# Patient Record
Sex: Female | Born: 1945 | Race: Black or African American | Hispanic: No | Marital: Married | Smoking: Former smoker
Health system: Southern US, Community
[De-identification: ages and names within clinical notes are randomized; demographics above are authoritative.]

## PROBLEM LIST (undated history)

## (undated) DIAGNOSIS — I10 Essential (primary) hypertension: Secondary | ICD-10-CM

---

## 2004-09-05 ENCOUNTER — Other Ambulatory Visit: Payer: Self-pay

## 2004-09-05 ENCOUNTER — Emergency Department: Payer: Self-pay | Admitting: Emergency Medicine

## 2005-07-09 ENCOUNTER — Ambulatory Visit: Payer: Self-pay | Admitting: Family Medicine

## 2006-07-15 ENCOUNTER — Ambulatory Visit: Payer: Self-pay | Admitting: Family Medicine

## 2008-01-13 ENCOUNTER — Ambulatory Visit: Payer: Self-pay | Admitting: Family Medicine

## 2009-02-12 ENCOUNTER — Ambulatory Visit: Payer: Self-pay | Admitting: Family Medicine

## 2010-02-27 ENCOUNTER — Ambulatory Visit: Payer: Self-pay | Admitting: Family Medicine

## 2013-12-23 ENCOUNTER — Ambulatory Visit: Payer: Self-pay | Admitting: Family Medicine

## 2014-01-11 ENCOUNTER — Ambulatory Visit: Payer: Self-pay | Admitting: Family Medicine

## 2015-01-09 ENCOUNTER — Other Ambulatory Visit: Payer: Self-pay | Admitting: Family Medicine

## 2015-01-09 DIAGNOSIS — Z1231 Encounter for screening mammogram for malignant neoplasm of breast: Secondary | ICD-10-CM

## 2015-01-11 ENCOUNTER — Other Ambulatory Visit: Payer: Self-pay | Admitting: Family Medicine

## 2015-01-11 ENCOUNTER — Ambulatory Visit
Admission: RE | Admit: 2015-01-11 | Discharge: 2015-01-11 | Disposition: A | Discharge status: Home / Self Care | Payer: Medicare Other | Source: Ambulatory Visit | Attending: Family Medicine | Admitting: Family Medicine

## 2015-01-11 DIAGNOSIS — Z1231 Encounter for screening mammogram for malignant neoplasm of breast: Secondary | ICD-10-CM | POA: Insufficient documentation

## 2015-10-20 IMAGING — CR DG CHEST 2V
1 series · 2 of 2 positions shown · non-contrast
Comparison: Chest x-ray of 12/23/2013

CLINICAL DATA: Pneumonia 1 week ago, followup, persistent cough

EXAM:
CHEST  2 VIEW

[Series 1: w chest pa · 0.14mm/px · 2 of 2 slices shown]
[im 1/2]
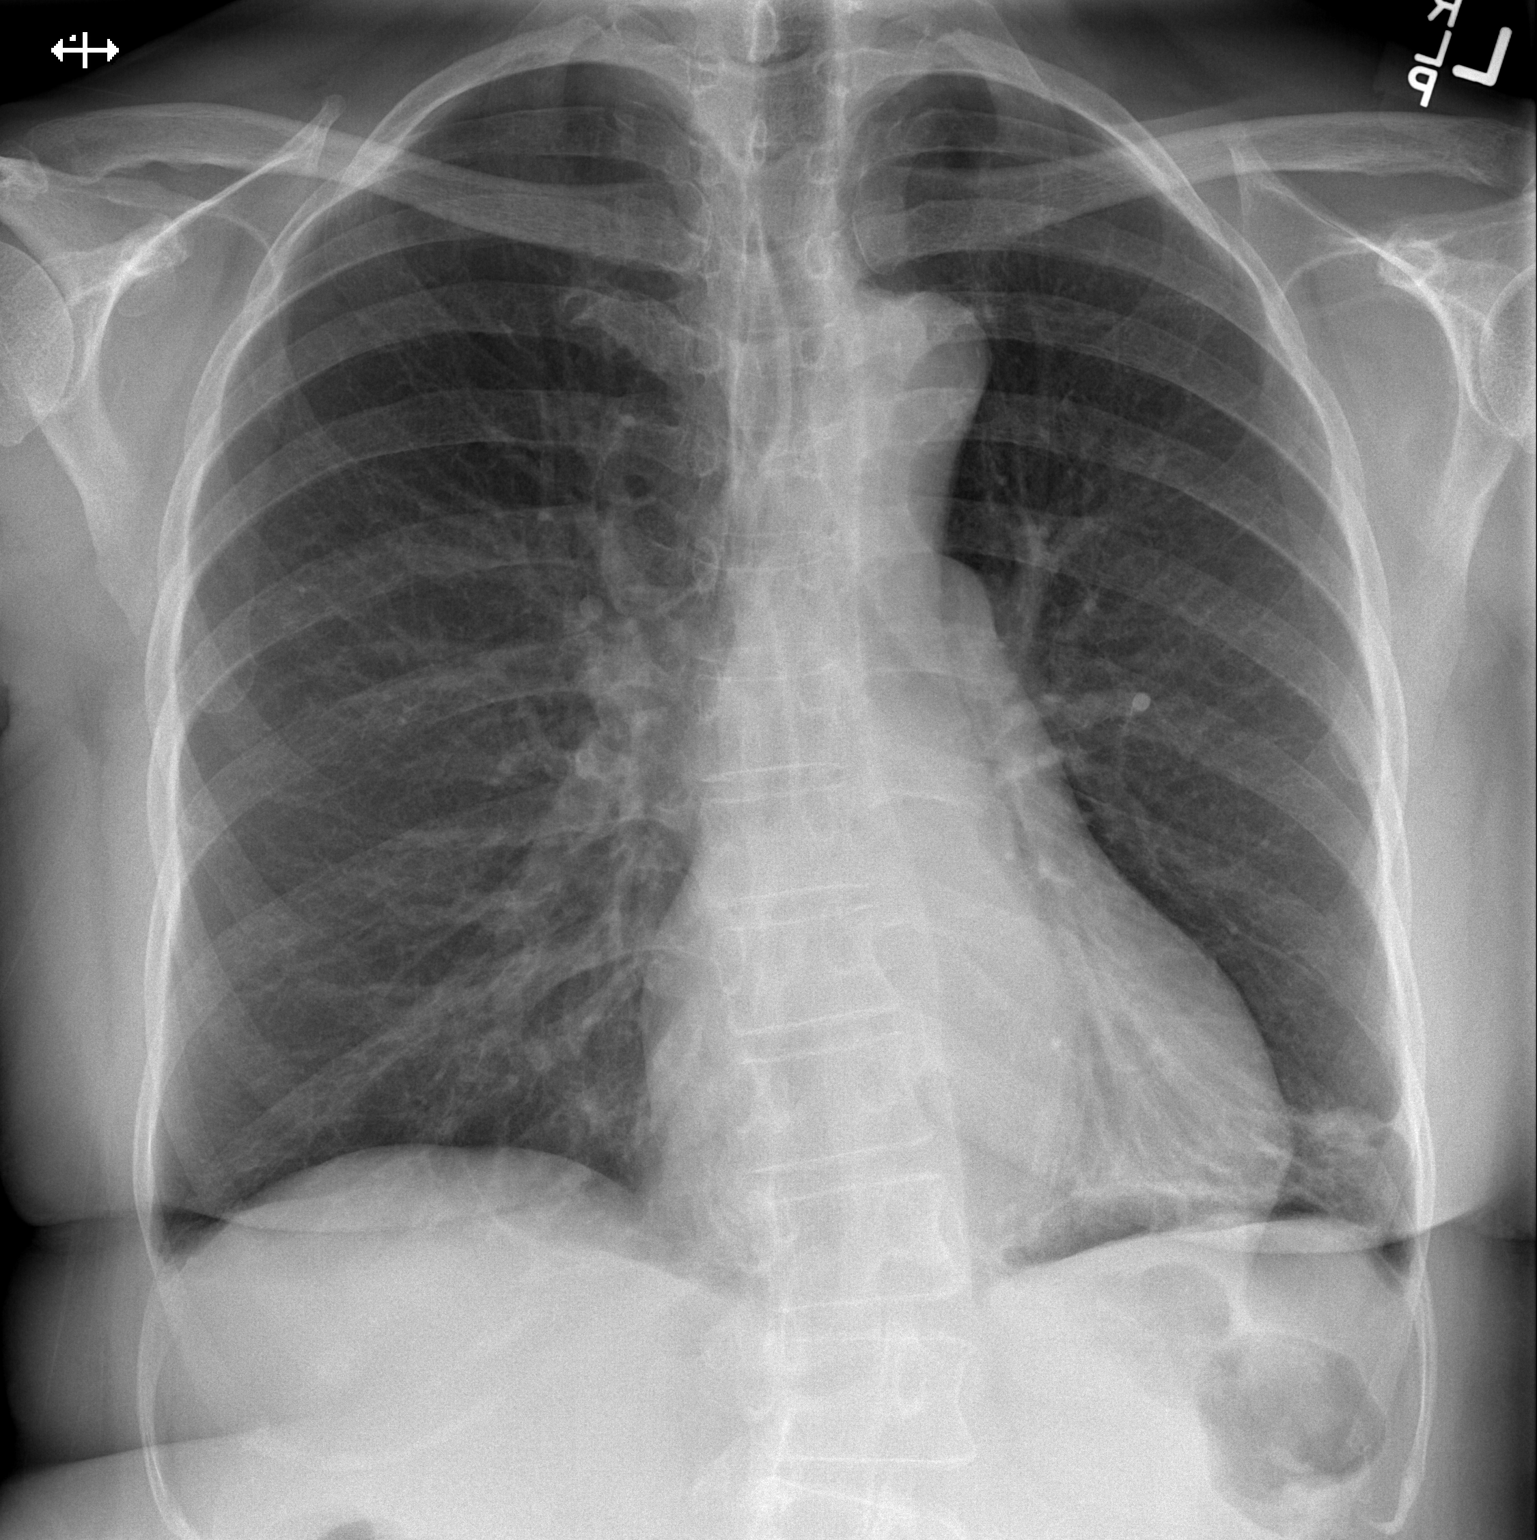
[im 2/2]
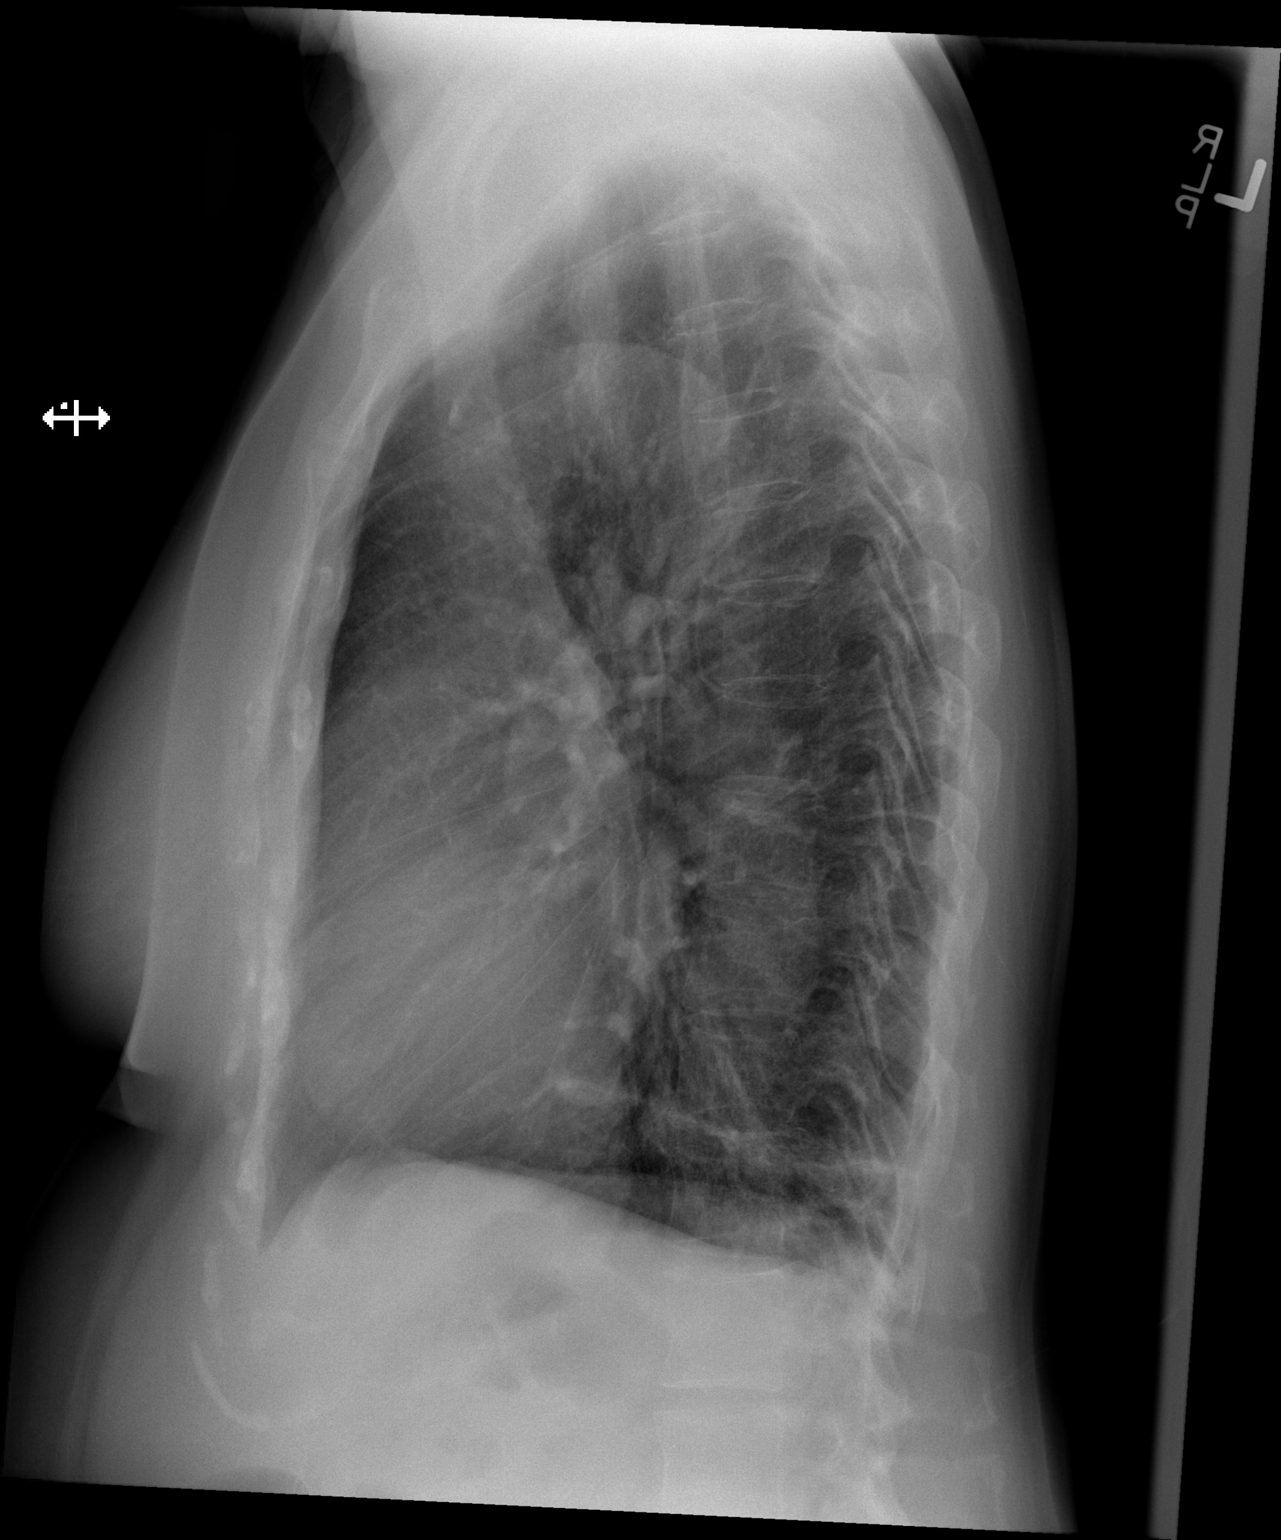

[2 of 2 positions shown; findings below may reference images not displayed]

FINDINGS: Streaky linear opacity remains in the left lower lobe representing
either linear atelectasis or residual pneumonia. Minimal linear
atelectasis or scarring is noted medially at the right lung base as
well. No other infiltrate is seen and no effusion is noted. The
heart is mildly enlarged and stable. No acute bony abnormality is
seen.
IMPRESSION: Linear opacity remains in the left lower lobe representing either
linear atelectasis or residual pneumonia.

## 2016-01-29 ENCOUNTER — Other Ambulatory Visit: Payer: Self-pay | Admitting: Family Medicine

## 2016-01-29 DIAGNOSIS — Z1231 Encounter for screening mammogram for malignant neoplasm of breast: Secondary | ICD-10-CM

## 2016-02-18 ENCOUNTER — Other Ambulatory Visit: Payer: Self-pay | Admitting: Family Medicine

## 2016-02-18 ENCOUNTER — Ambulatory Visit
Admission: RE | Admit: 2016-02-18 | Discharge: 2016-02-18 | Disposition: A | Discharge status: Home / Self Care | Payer: Medicare Other | Source: Ambulatory Visit | Attending: Family Medicine | Admitting: Family Medicine

## 2016-02-18 DIAGNOSIS — Z1231 Encounter for screening mammogram for malignant neoplasm of breast: Secondary | ICD-10-CM | POA: Diagnosis present

## 2016-10-19 IMAGING — MG MM DIGITAL SCREENING BILAT W/ TOMO W/ CAD
9 of 17 series · 9 of 37 positions shown · non-contrast
Comparison: Previous exam(s).

CLINICAL DATA: Screening.

EXAM:
DIGITAL SCREENING BILATERAL MAMMOGRAM WITH 3D TOMO WITH CAD

[R CC]
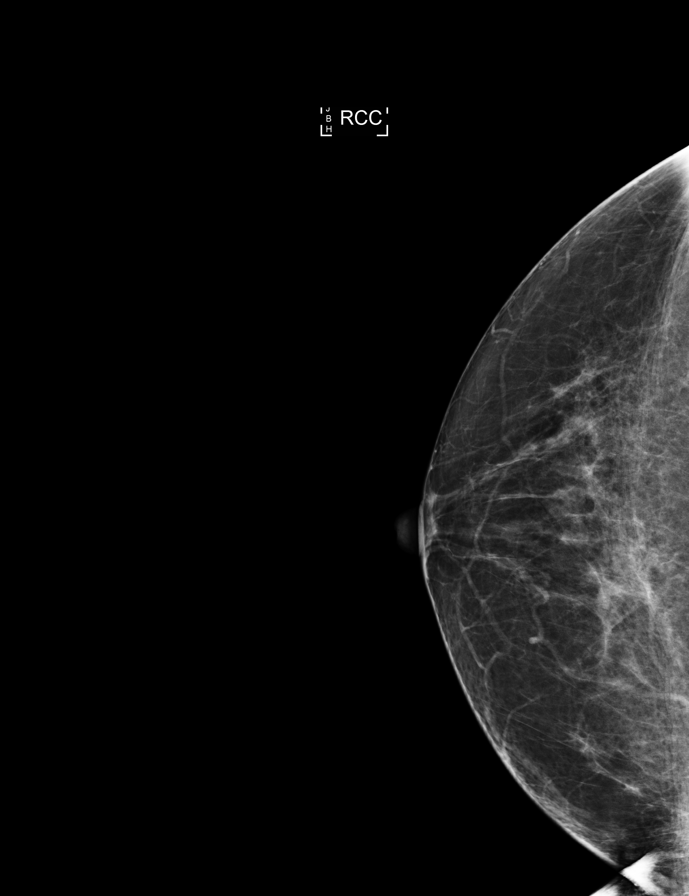

[L MLO (1 of 2)]
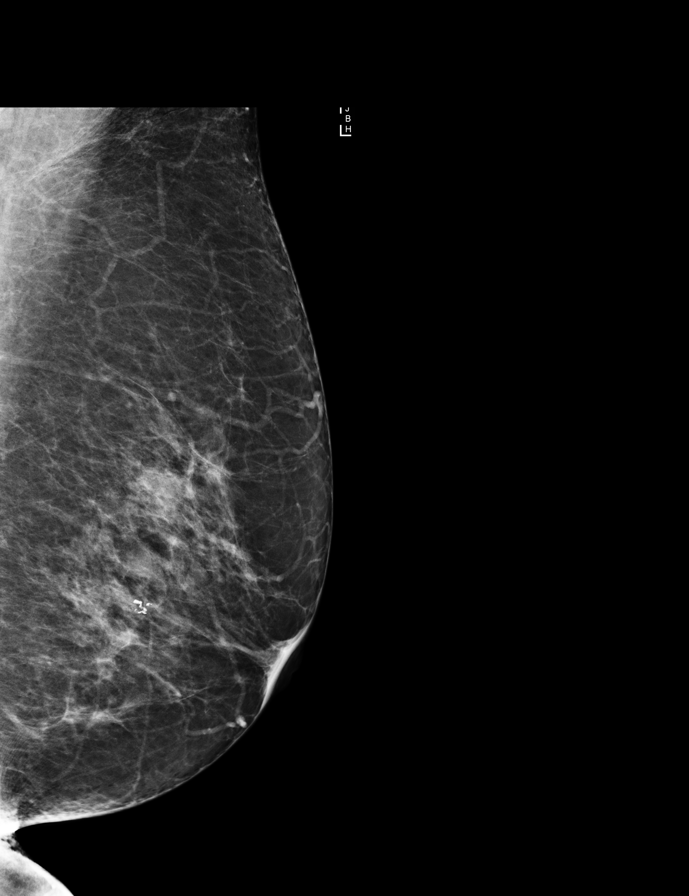

[L CC]
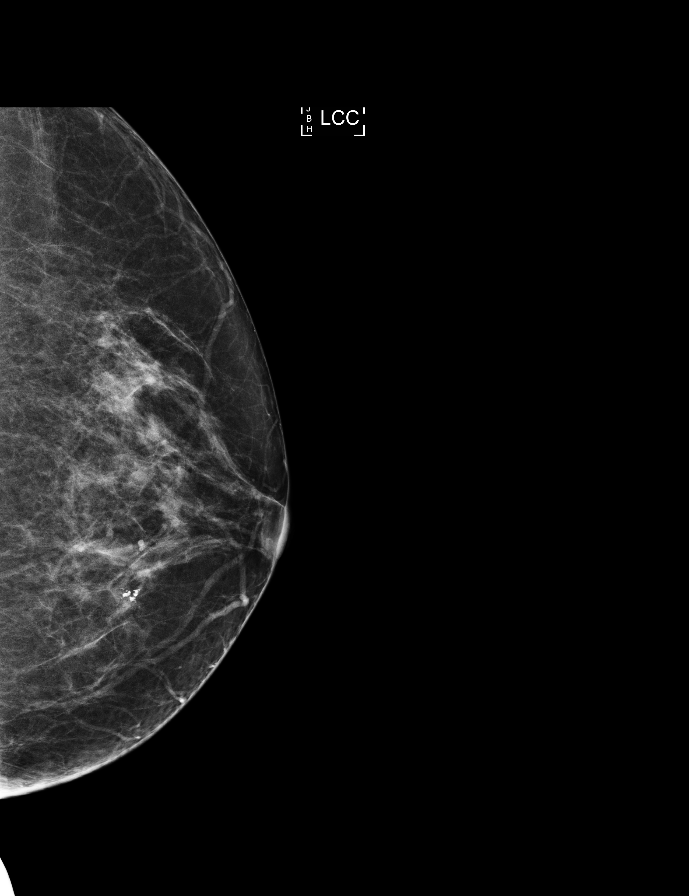

[R CC synth-2D]
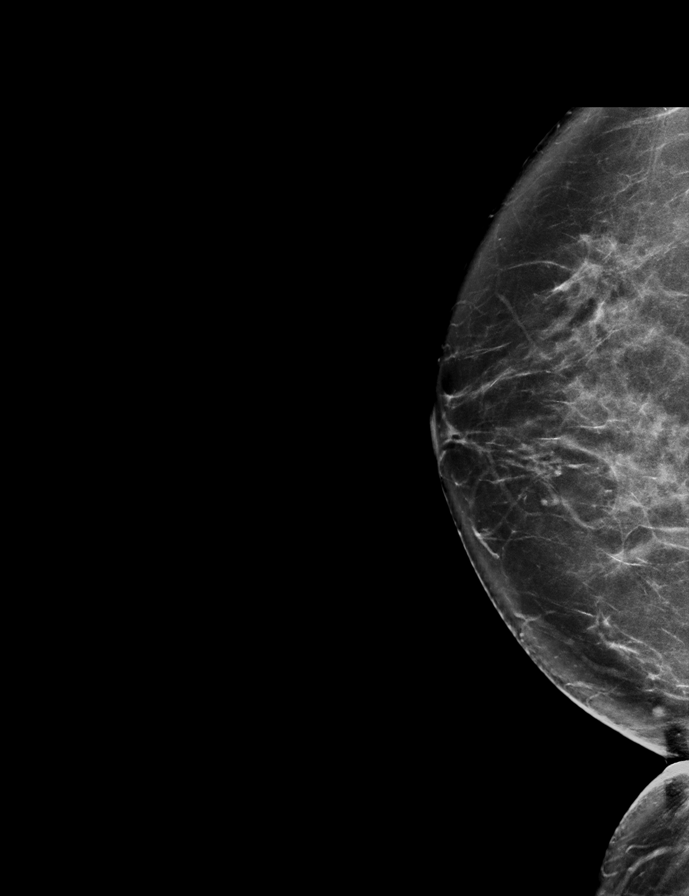

[R MLO synth-2D (1 of 2)]
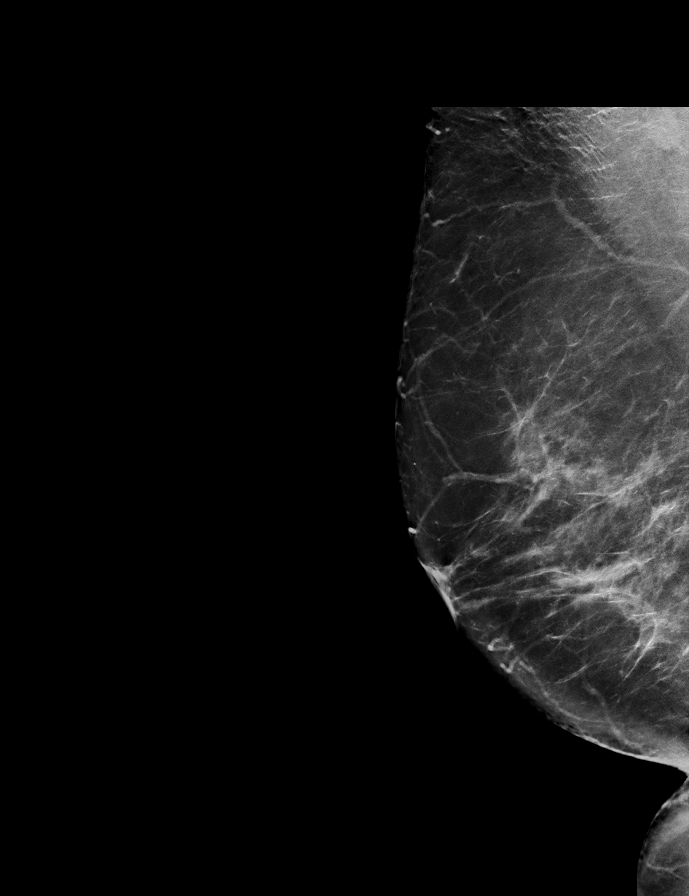

[R MLO (1 of 2)]
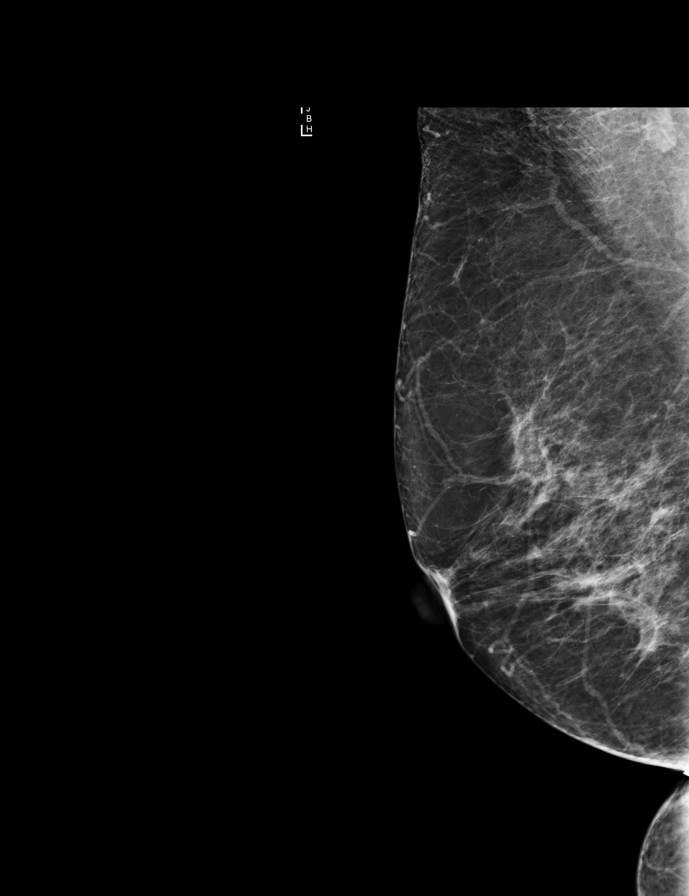

[L MLO (2 of 2)]
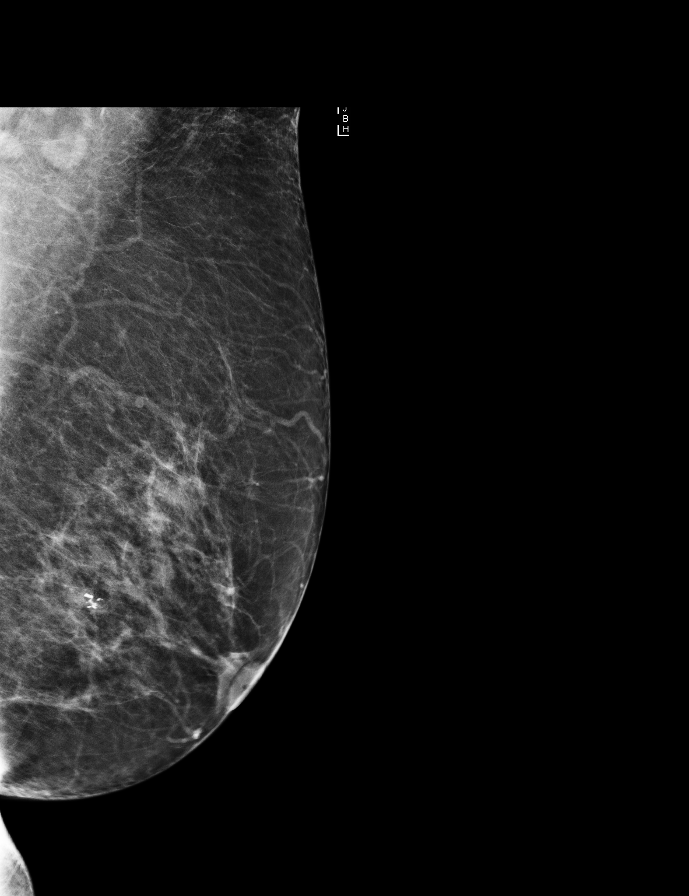

[R MLO (2 of 2)]
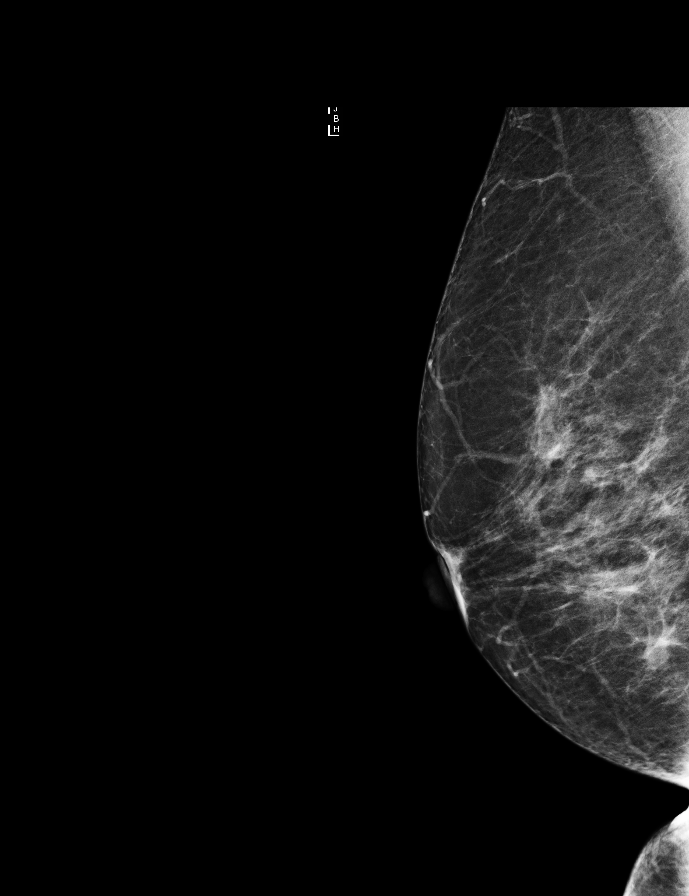

[R MLO synth-2D (2 of 2)]
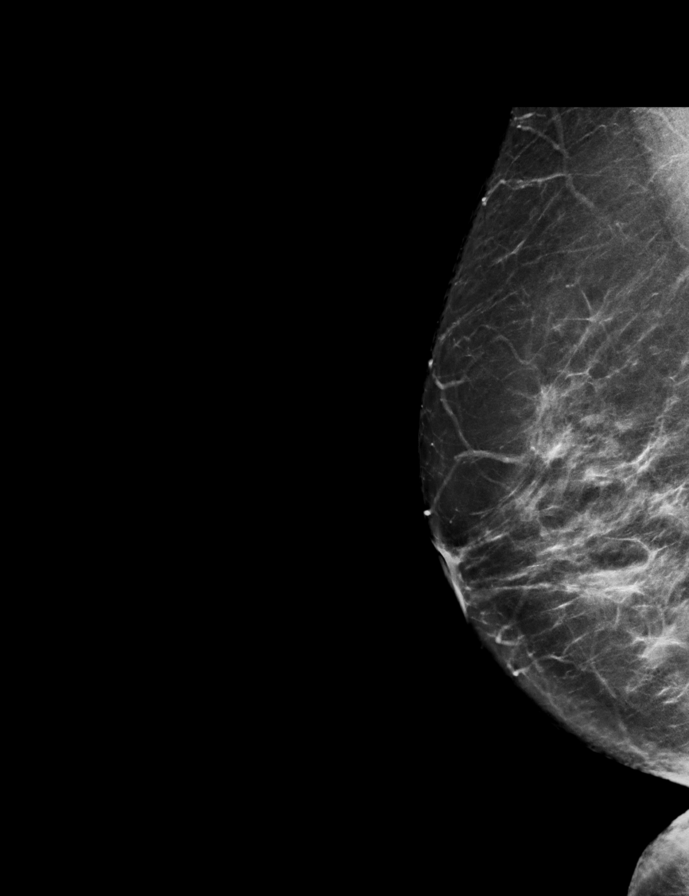

[9 of 37 positions shown; findings below may reference images not displayed]

ACR Breast Density Category c: The breast tissue is heterogeneously
dense, which may obscure small masses.
FINDINGS: There are no findings suspicious for malignancy. Images were
processed with CAD.
IMPRESSION: No mammographic evidence of malignancy. A result letter of this
screening mammogram will be mailed directly to the patient.

RECOMMENDATION:
Screening mammogram in one year. (Code:[id number])

BI-RADS CATEGORY  1: Negative.

## 2017-01-19 ENCOUNTER — Other Ambulatory Visit: Payer: Self-pay | Admitting: Family Medicine

## 2017-01-19 DIAGNOSIS — Z1231 Encounter for screening mammogram for malignant neoplasm of breast: Secondary | ICD-10-CM

## 2017-02-23 ENCOUNTER — Ambulatory Visit
Admission: RE | Admit: 2017-02-23 | Discharge: 2017-02-23 | Disposition: A | Discharge status: Home / Self Care | Payer: Medicare Other | Source: Ambulatory Visit | Attending: Family Medicine | Admitting: Family Medicine

## 2017-02-23 DIAGNOSIS — Z1231 Encounter for screening mammogram for malignant neoplasm of breast: Secondary | ICD-10-CM

## 2017-11-26 IMAGING — MG MM DIGITAL SCREENING BILAT W/ TOMO W/ CAD
9 of 13 series · 9 of 29 positions shown · non-contrast
Comparison: Previous exam(s).

CLINICAL DATA: Screening.

EXAM:
2D DIGITAL SCREENING BILATERAL MAMMOGRAM WITH CAD AND ADJUNCT TOMO

[R MLO (1 of 2)]
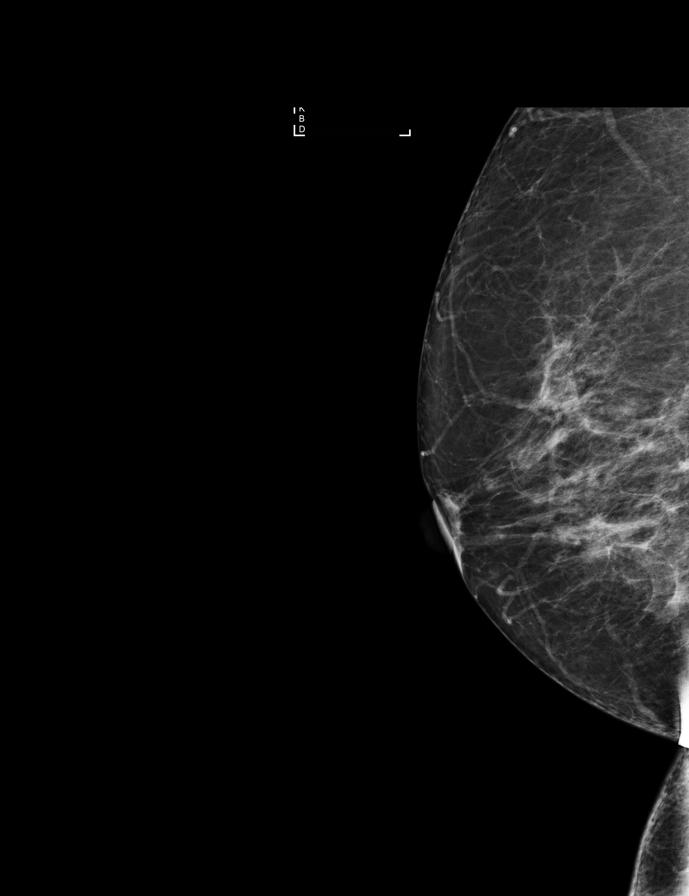

[R MLO (2 of 2)]
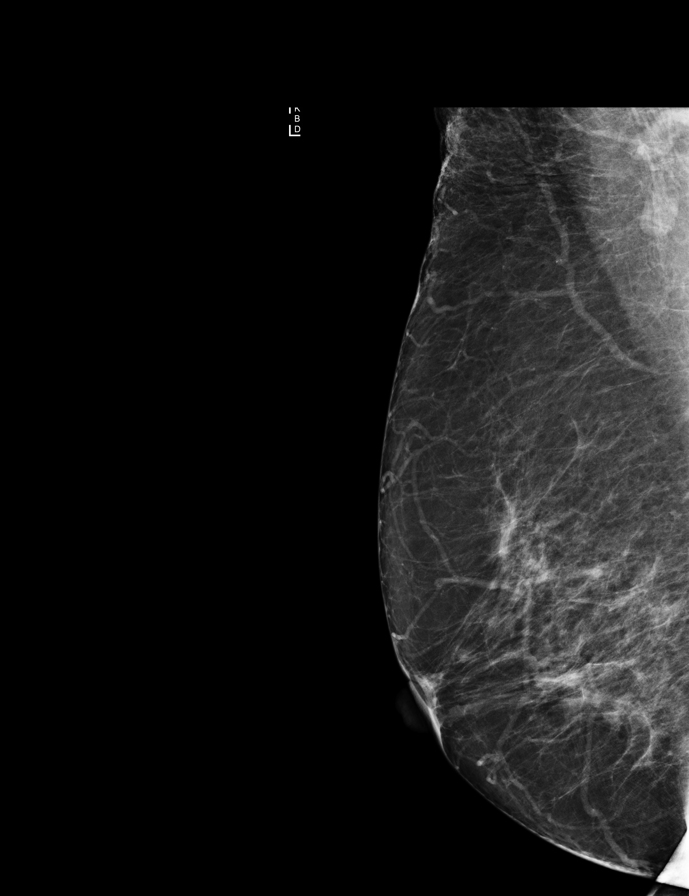

[R CC synth-2D]
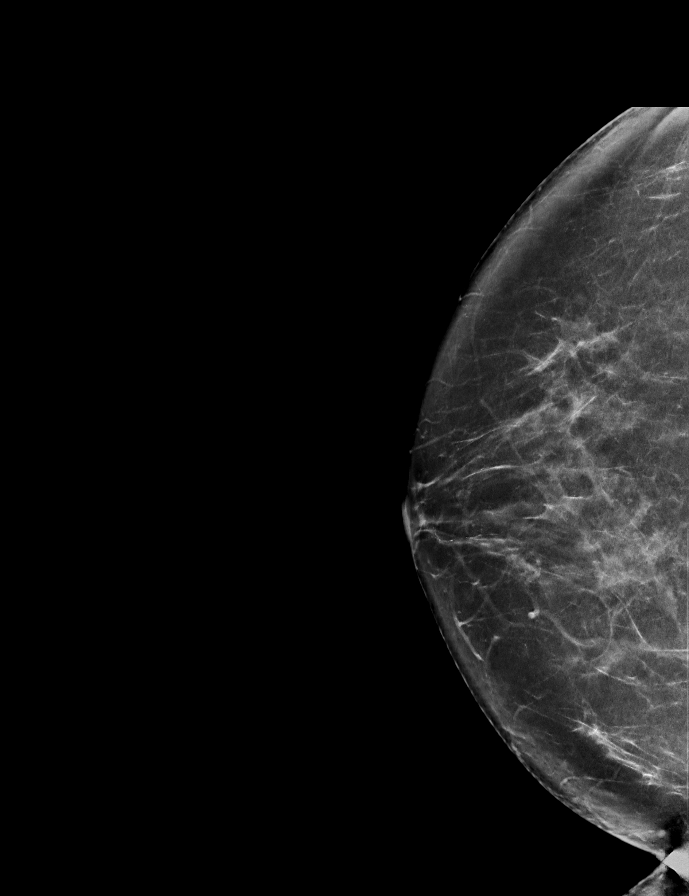

[L CC]
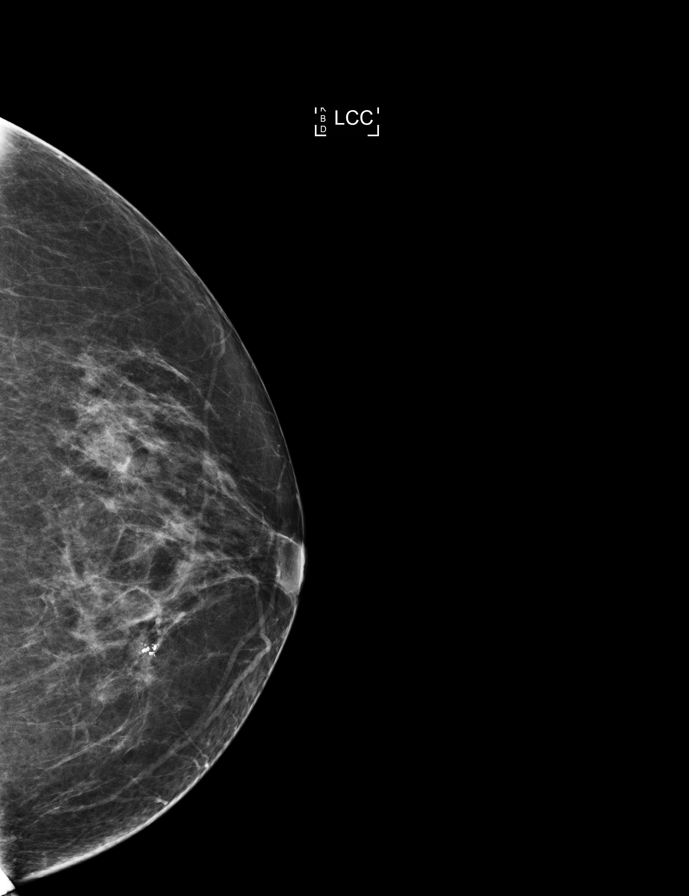

[L MLO synth-2D]
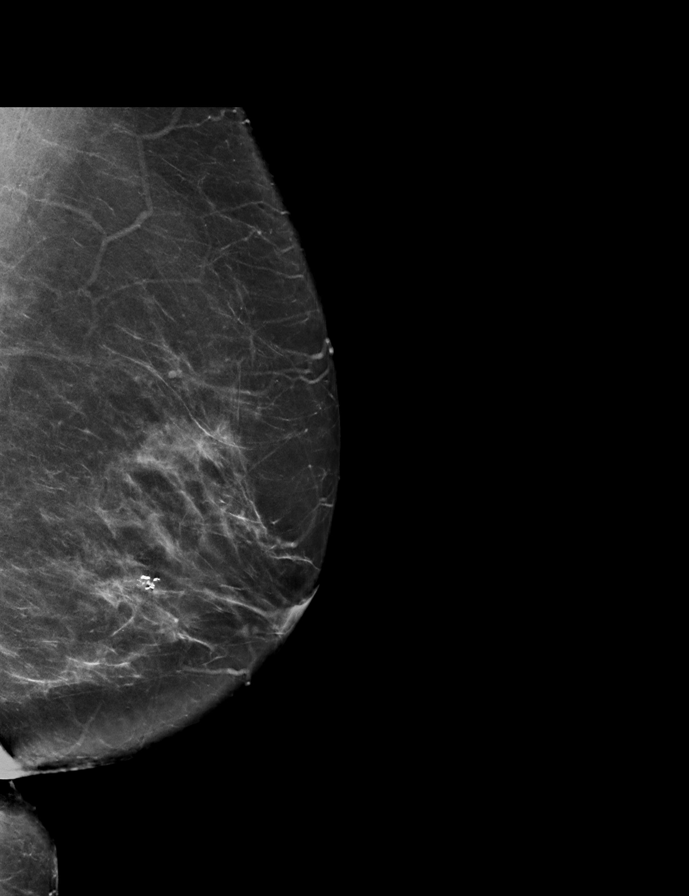

[R CC]
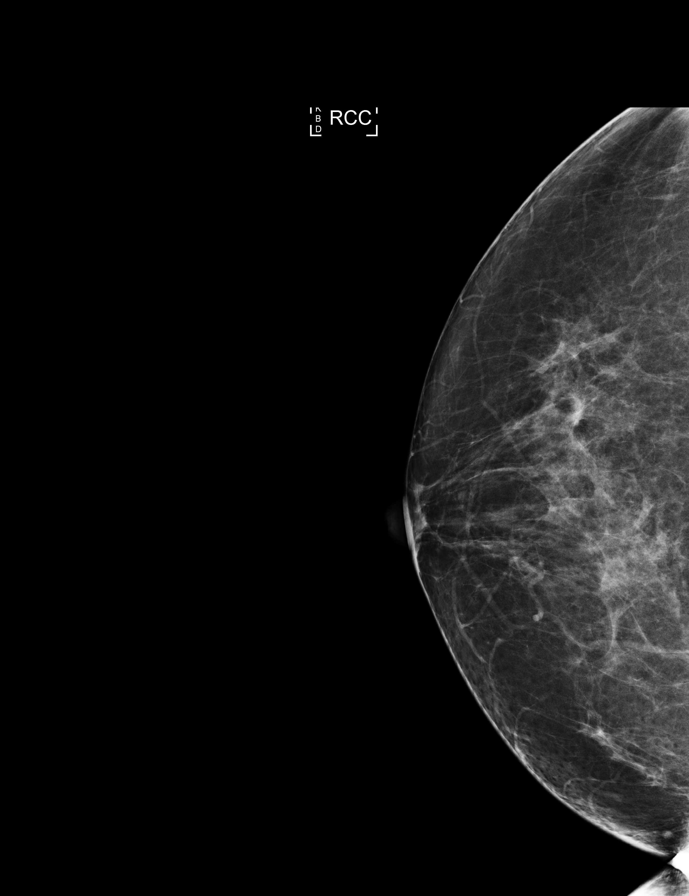

[L CC synth-2D]
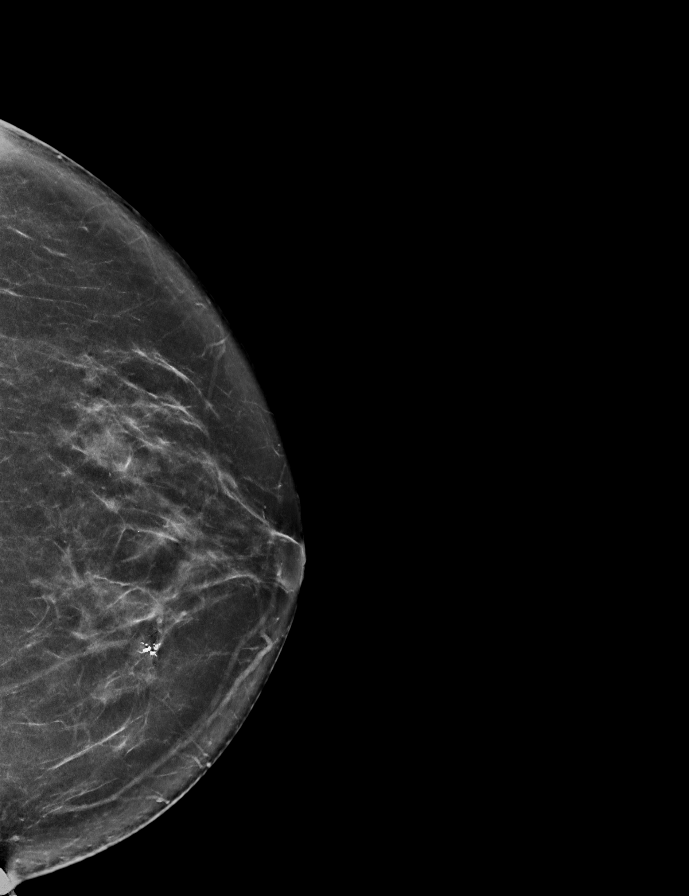

[R MLO synth-2D]
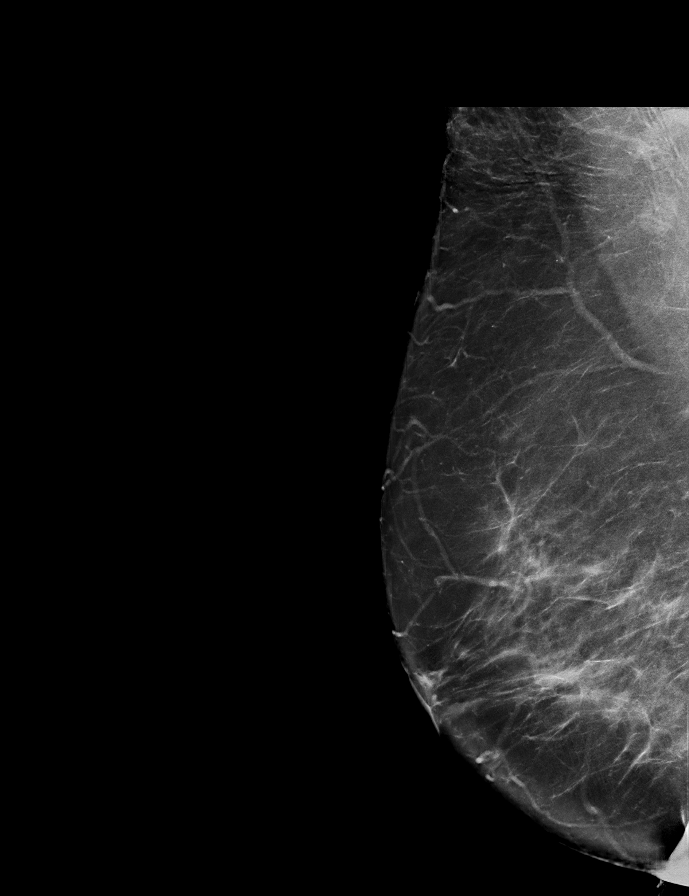

[L MLO]
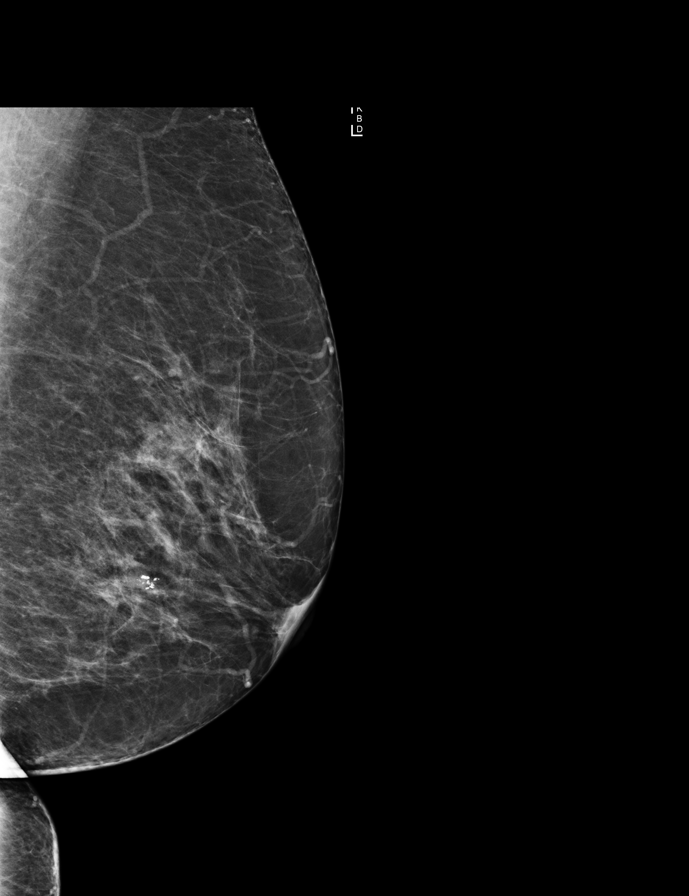

[9 of 29 positions shown; findings below may reference images not displayed]

ACR Breast Density Category b: There are scattered areas of
fibroglandular density.
FINDINGS: There are no findings suspicious for malignancy. Images were
processed with CAD.
IMPRESSION: No mammographic evidence of malignancy. A result letter of this
screening mammogram will be mailed directly to the patient.

RECOMMENDATION:
Screening mammogram in one year. (Code:97-6-RS4)

BI-RADS CATEGORY  1: Negative.

## 2018-01-27 ENCOUNTER — Other Ambulatory Visit: Payer: Self-pay | Admitting: Family Medicine

## 2018-01-27 DIAGNOSIS — Z1231 Encounter for screening mammogram for malignant neoplasm of breast: Secondary | ICD-10-CM

## 2018-02-24 ENCOUNTER — Ambulatory Visit
Admission: RE | Admit: 2018-02-24 | Discharge: 2018-02-24 | Disposition: A | Discharge status: Home / Self Care | Payer: Medicare Other | Source: Ambulatory Visit | Attending: Family Medicine | Admitting: Family Medicine

## 2018-02-24 DIAGNOSIS — Z1231 Encounter for screening mammogram for malignant neoplasm of breast: Secondary | ICD-10-CM

## 2018-12-02 IMAGING — MG MM DIGITAL SCREENING BILAT W/ TOMO W/ CAD
9 of 13 series · 9 of 29 positions shown · non-contrast
Comparison: Previous exam(s).

CLINICAL DATA: Screening.

EXAM:
2D DIGITAL SCREENING BILATERAL MAMMOGRAM WITH CAD AND ADJUNCT TOMO

[L MLO (1 of 2)]
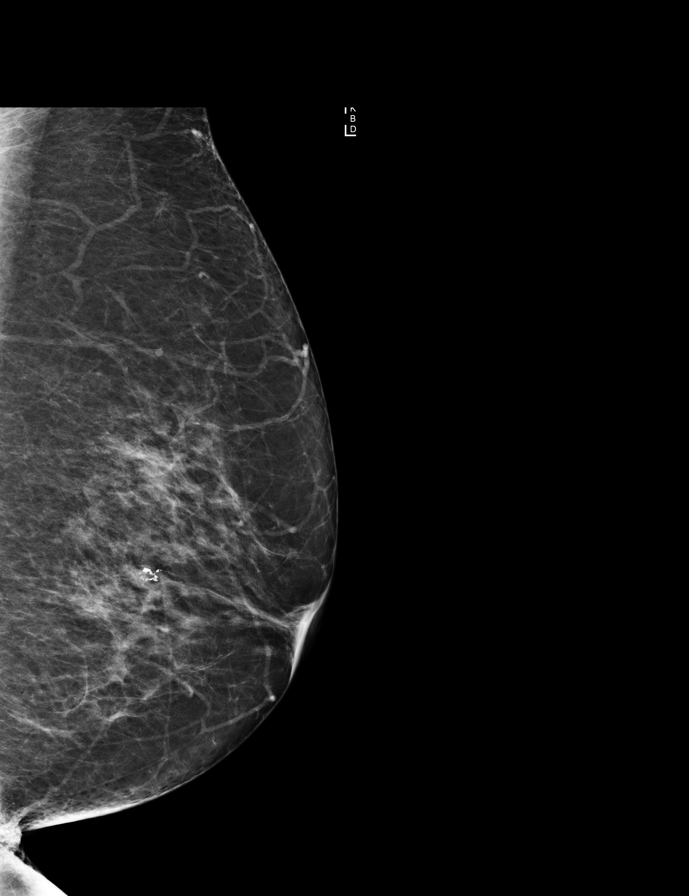

[R MLO]
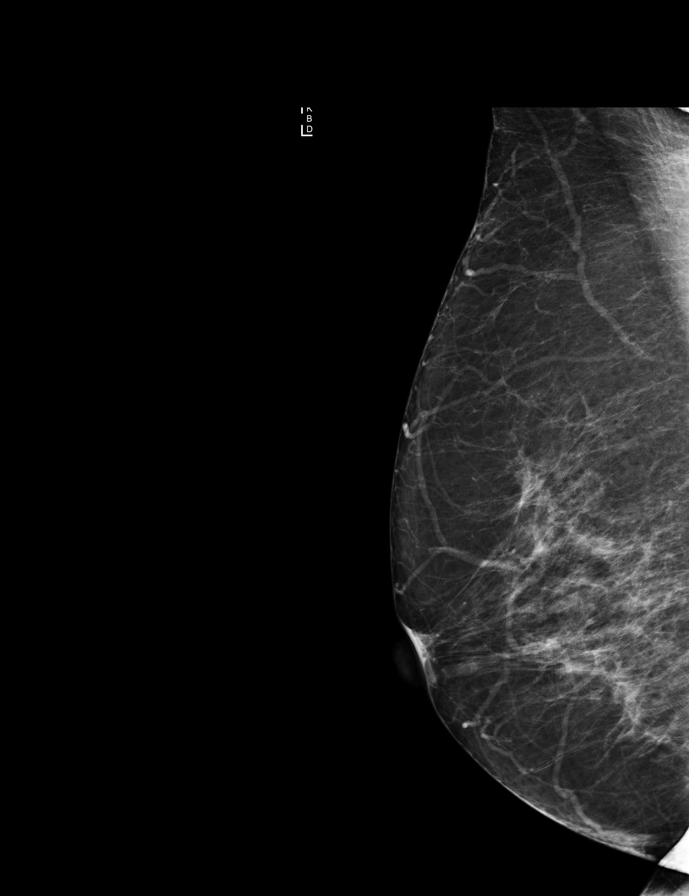

[L CC]
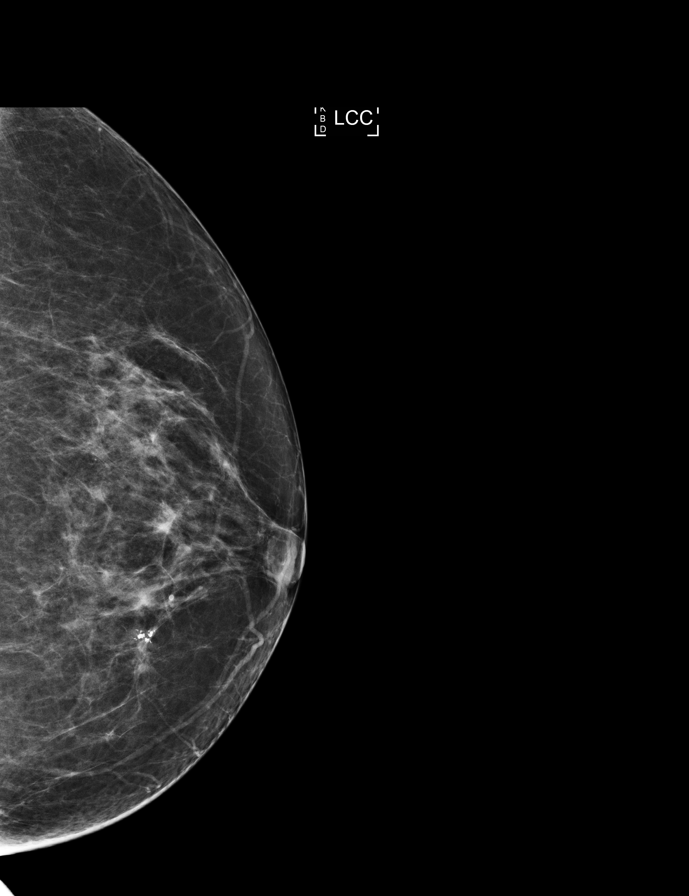

[L CC synth-2D]
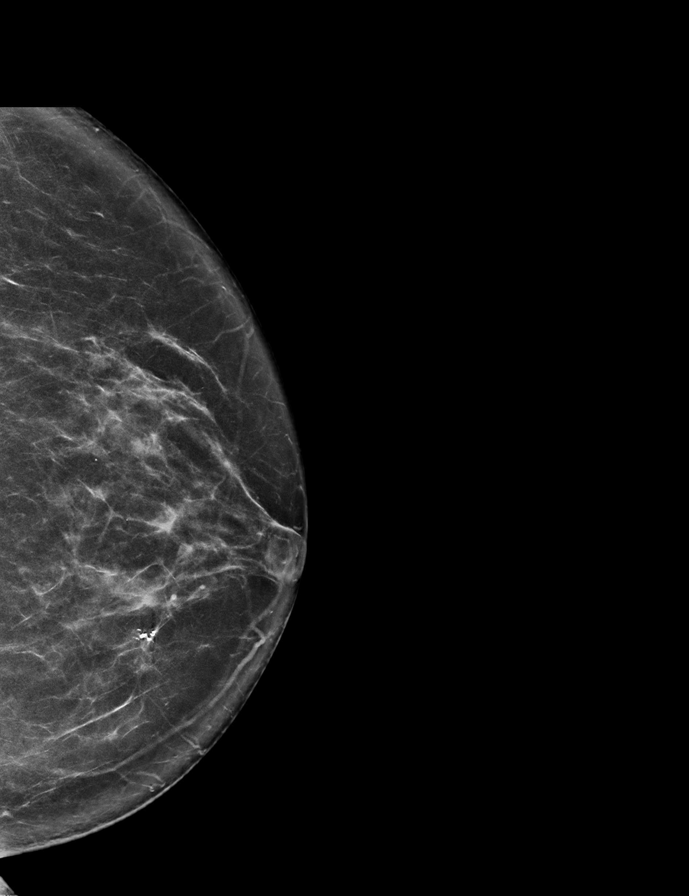

[R CC]
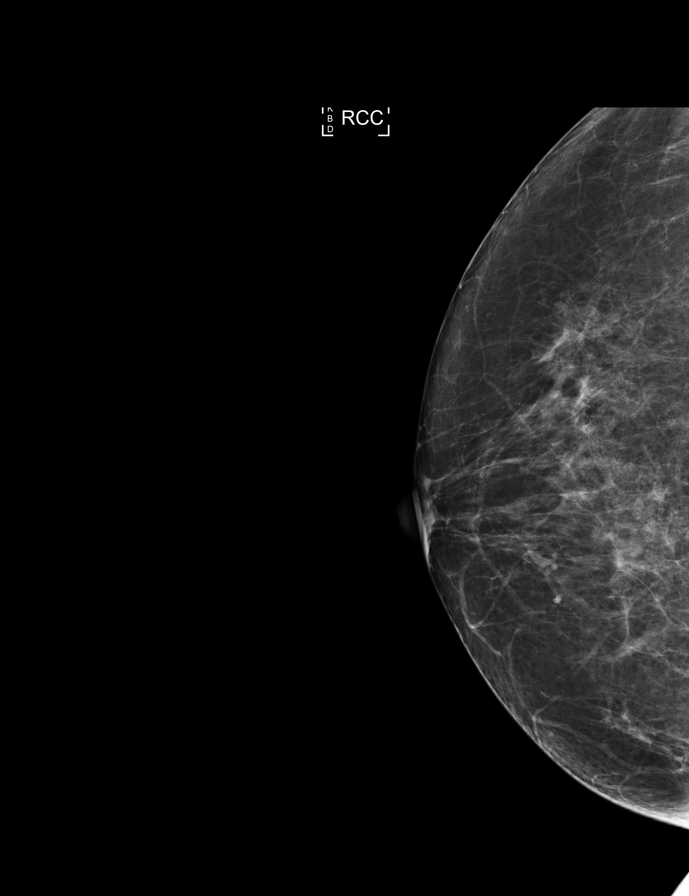

[R CC synth-2D]
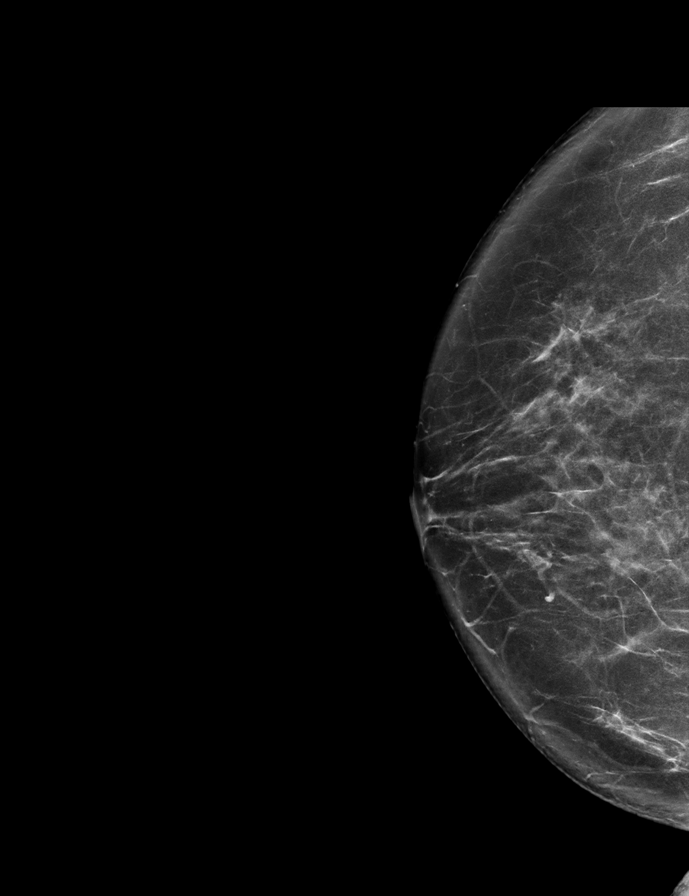

[L MLO (2 of 2)]
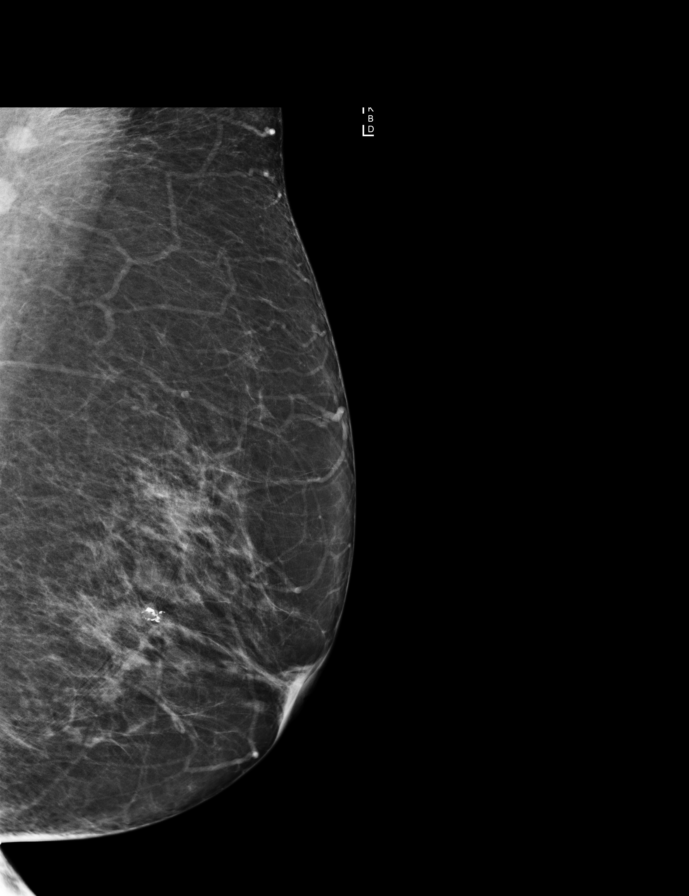

[L MLO synth-2D]
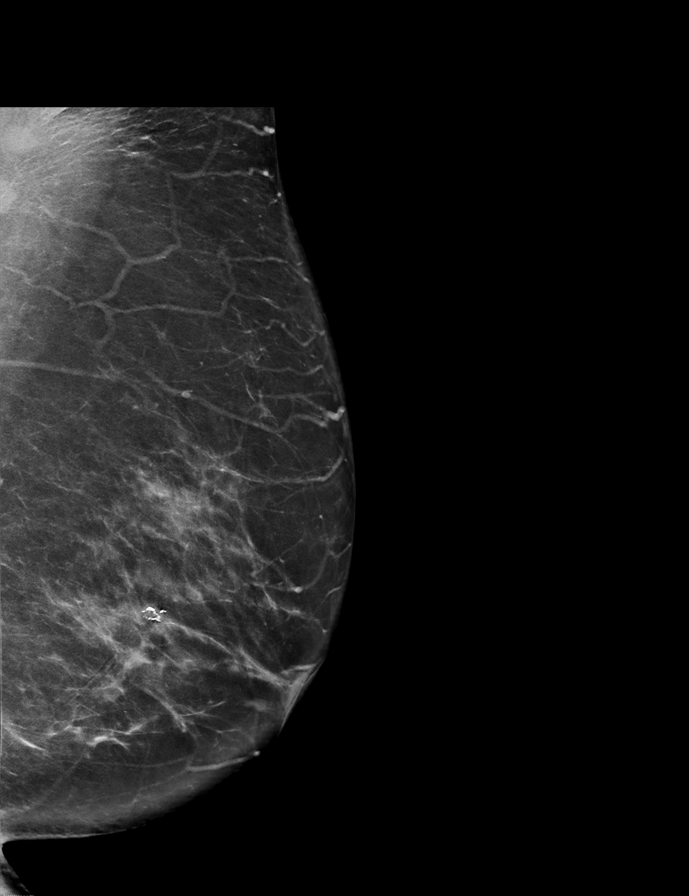

[R MLO synth-2D]
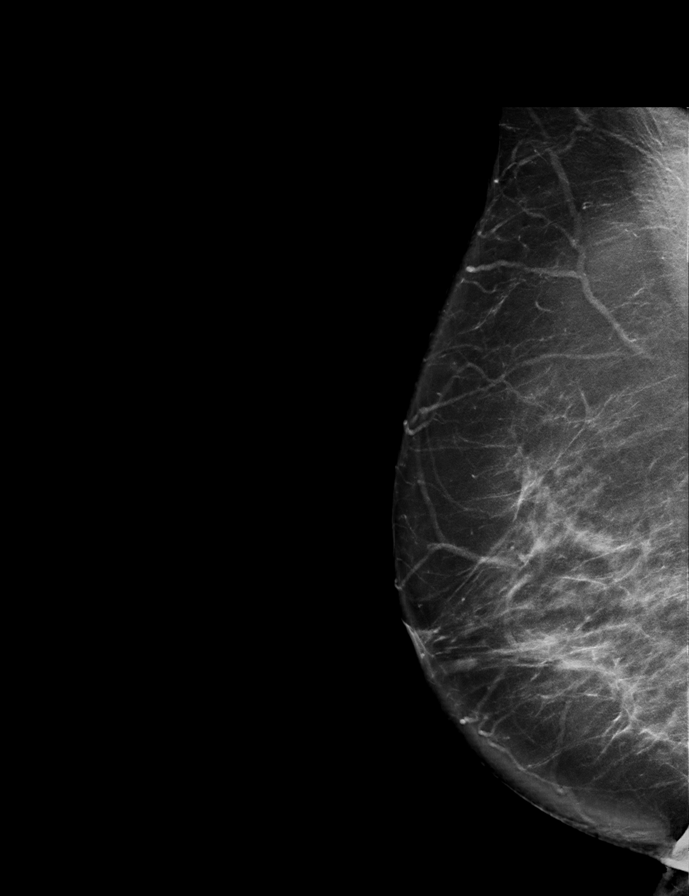

[9 of 29 positions shown; findings below may reference images not displayed]

ACR Breast Density Category b: There are scattered areas of
fibroglandular density.
FINDINGS: There are no findings suspicious for malignancy. Images were
processed with CAD.
IMPRESSION: No mammographic evidence of malignancy. A result letter of this
screening mammogram will be mailed directly to the patient.

RECOMMENDATION:
Screening mammogram in one year. (Code:97-6-RS4)

BI-RADS CATEGORY  1: Negative.

## 2019-01-17 ENCOUNTER — Other Ambulatory Visit: Payer: Self-pay | Admitting: Family Medicine

## 2019-01-17 DIAGNOSIS — Z1231 Encounter for screening mammogram for malignant neoplasm of breast: Secondary | ICD-10-CM

## 2019-03-01 ENCOUNTER — Ambulatory Visit
Admission: RE | Admit: 2019-03-01 | Discharge: 2019-03-01 | Disposition: A | Discharge status: Home / Self Care | Payer: Medicare Other | Source: Ambulatory Visit | Attending: Family Medicine | Admitting: Family Medicine

## 2019-03-01 DIAGNOSIS — Z1231 Encounter for screening mammogram for malignant neoplasm of breast: Secondary | ICD-10-CM | POA: Diagnosis not present

## 2019-12-03 IMAGING — MG MM DIGITAL SCREENING BILAT W/ TOMO W/ CAD
6 of 10 series · 6 of 30 positions shown · non-contrast
Comparison: Previous exam(s).

CLINICAL DATA: Screening.

EXAM:
DIGITAL SCREENING BILATERAL MAMMOGRAM WITH TOMO AND CAD

[L MLO synth-2D]
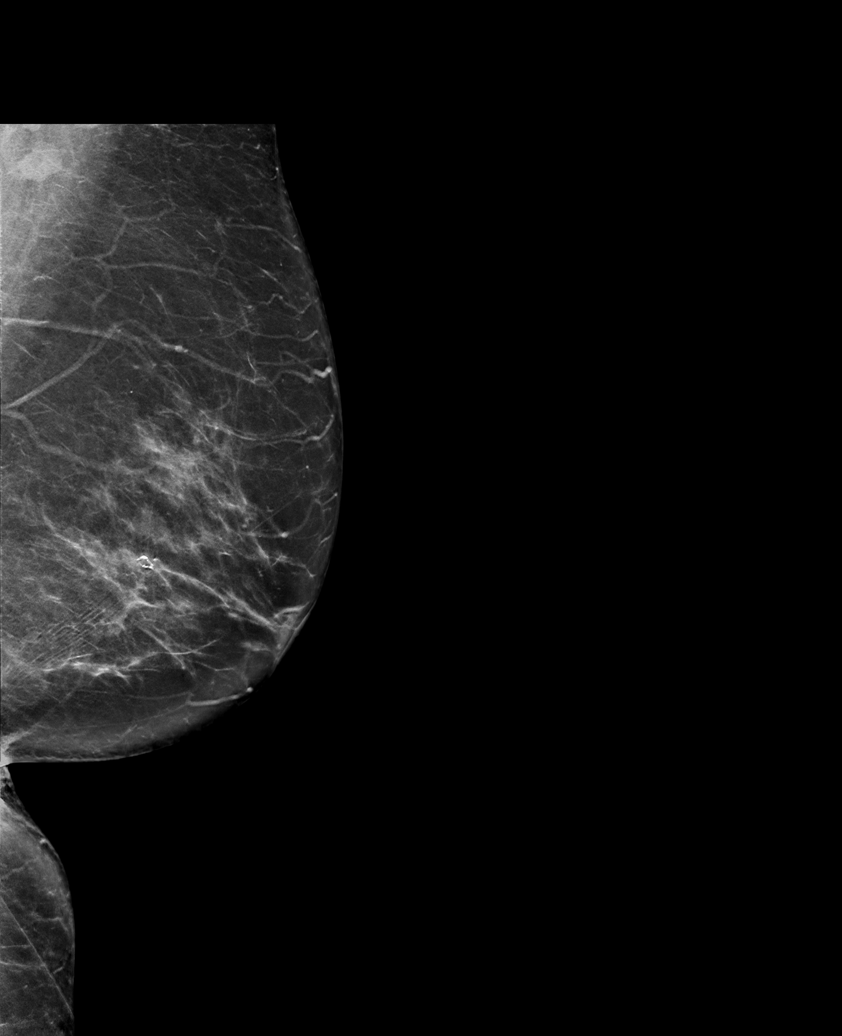

[R MLO synth-2D (1 of 2)]
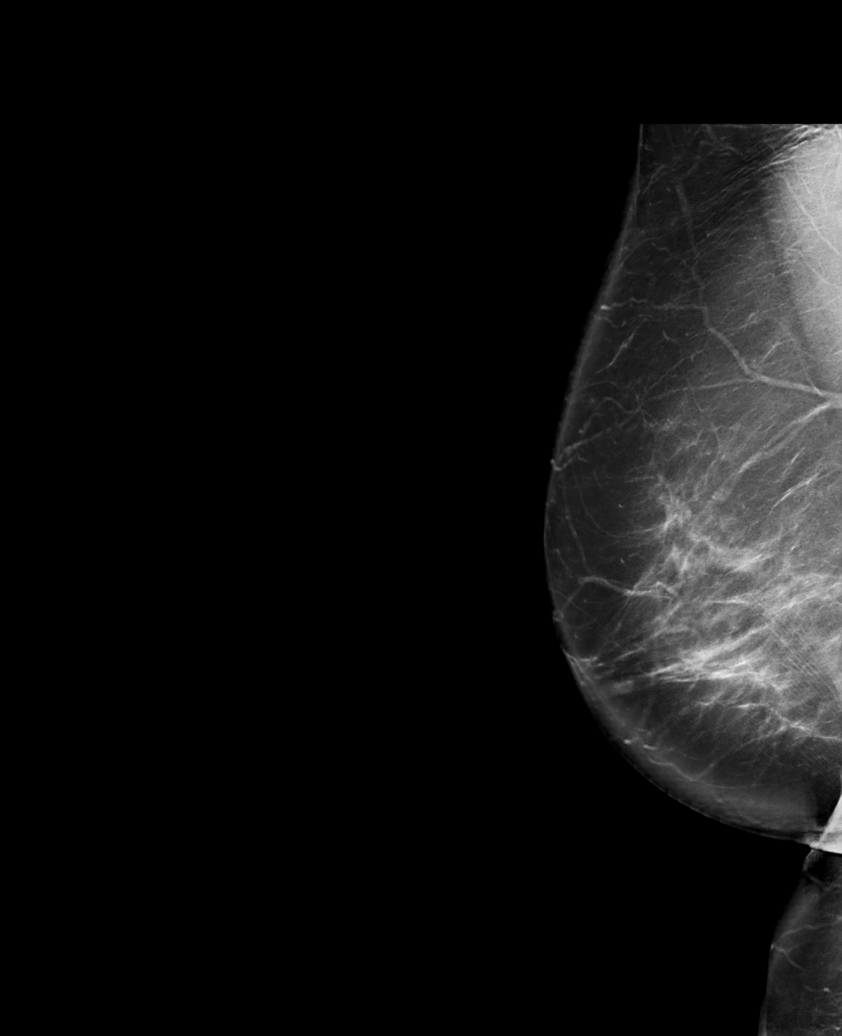

[R MLO synth-2D (2 of 2)]
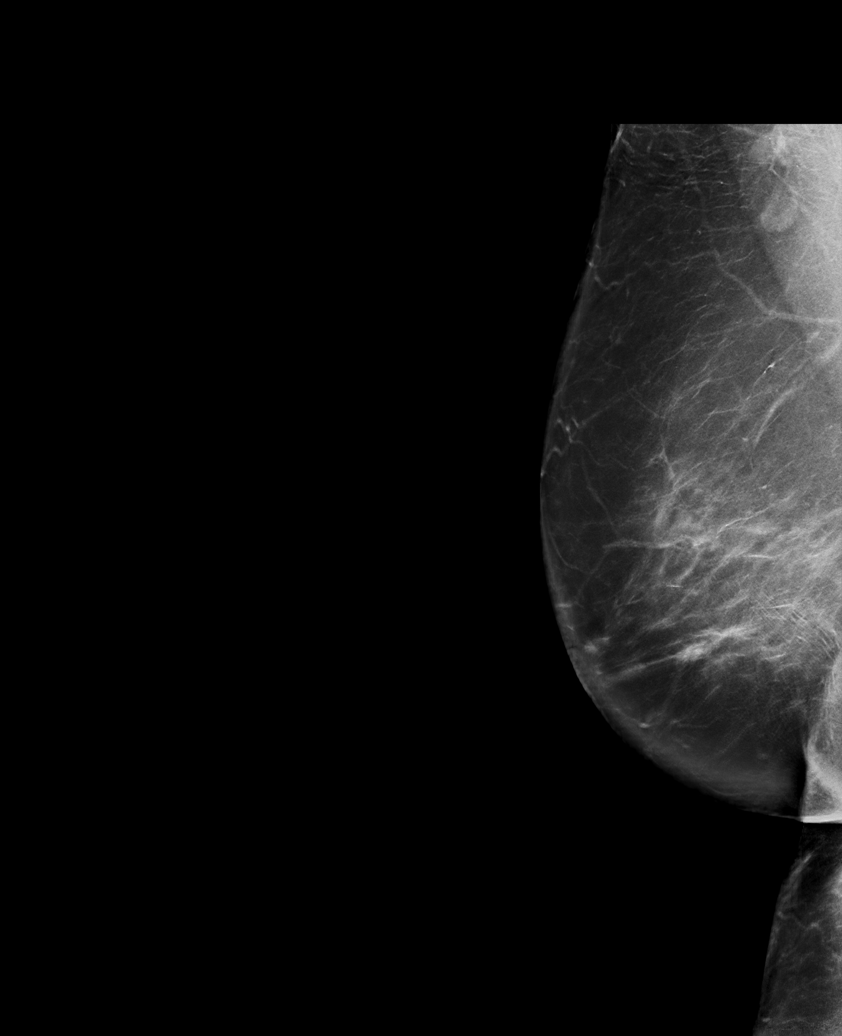

[L CC synth-2D]
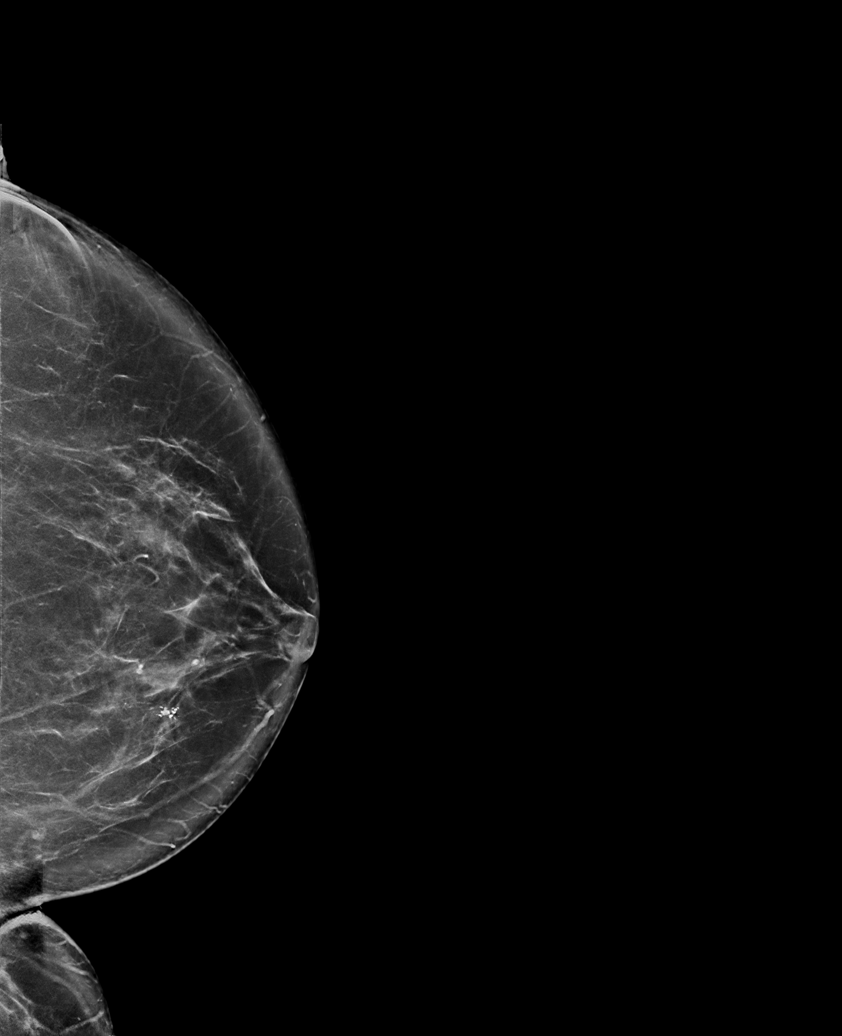

[R CC synth-2D]
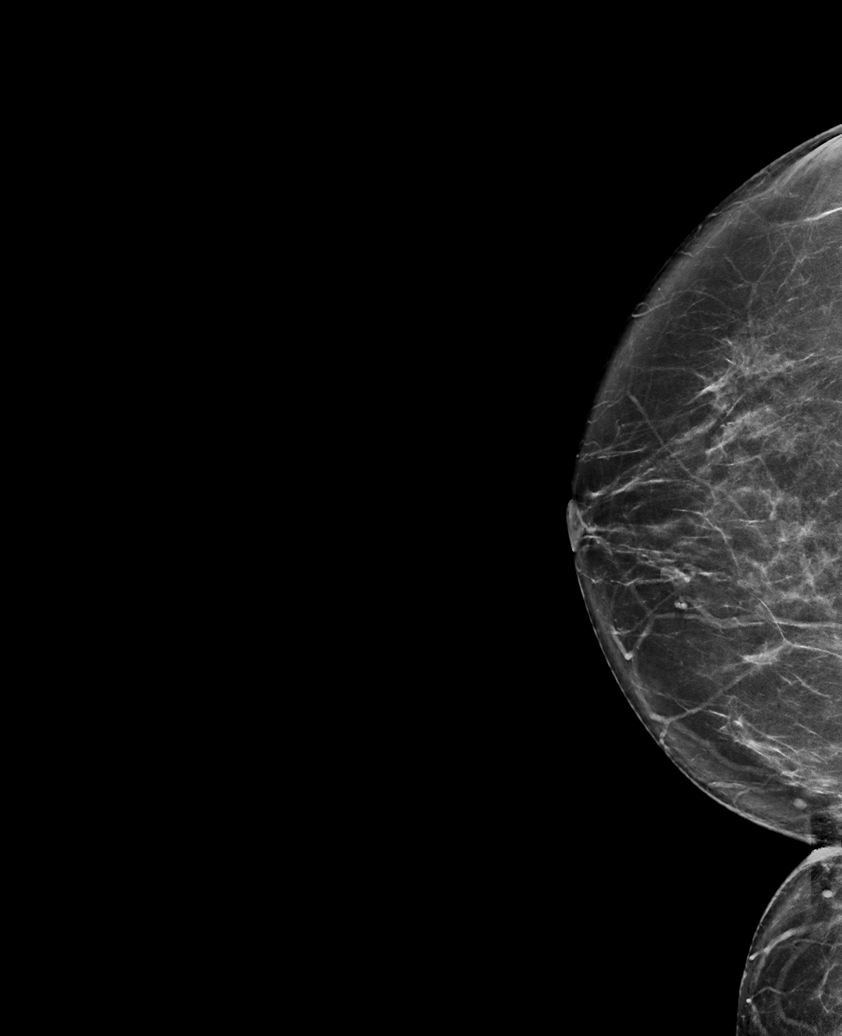

[R MLO tomo · tomo slice 52/103.0]
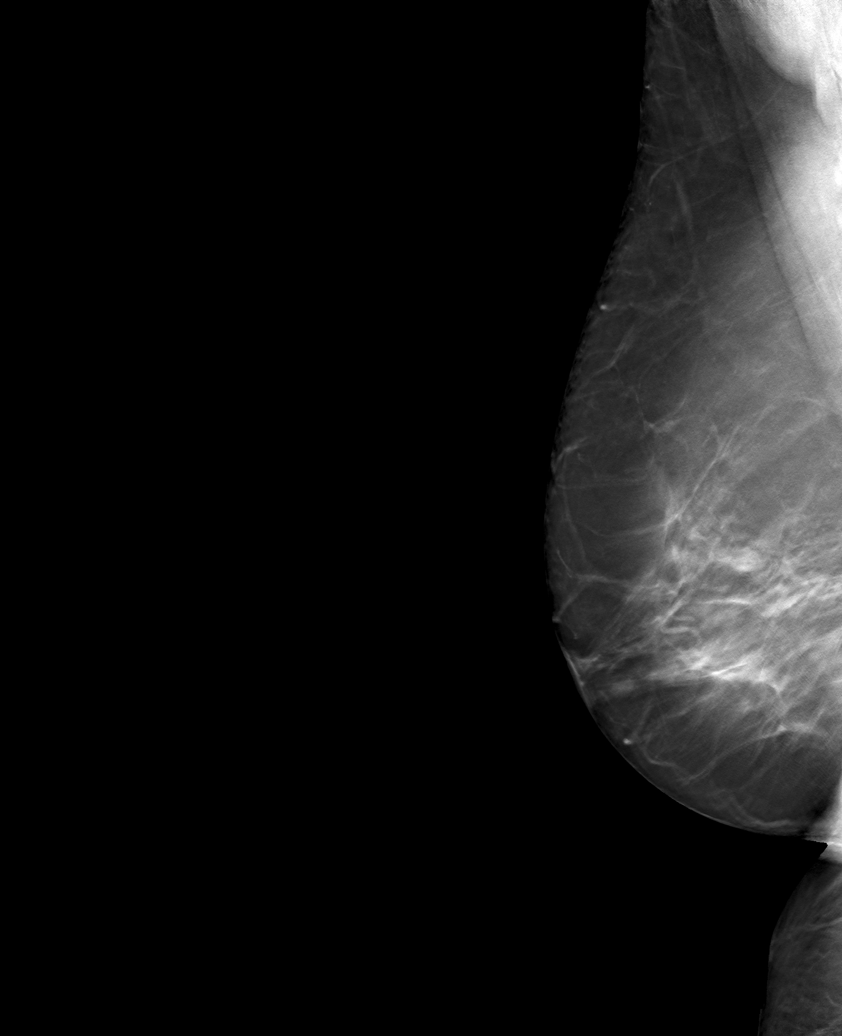

[6 of 30 positions shown; findings below may reference images not displayed]

ACR Breast Density Category b: There are scattered areas of
fibroglandular density.
FINDINGS: There are no findings suspicious for malignancy. Images were
processed with CAD.
IMPRESSION: No mammographic evidence of malignancy. A result letter of this
screening mammogram will be mailed directly to the patient.

RECOMMENDATION:
Screening mammogram in one year. (Code:[id number])

BI-RADS CATEGORY  1: Negative.

## 2020-01-24 ENCOUNTER — Other Ambulatory Visit: Payer: Self-pay | Admitting: Family Medicine

## 2020-01-24 DIAGNOSIS — Z1231 Encounter for screening mammogram for malignant neoplasm of breast: Secondary | ICD-10-CM

## 2020-03-01 ENCOUNTER — Ambulatory Visit
Admission: RE | Admit: 2020-03-01 | Discharge: 2020-03-01 | Disposition: A | Discharge status: Home / Self Care | Payer: Medicare Other | Source: Ambulatory Visit | Attending: Family Medicine | Admitting: Family Medicine

## 2020-03-01 ENCOUNTER — Other Ambulatory Visit: Payer: Self-pay

## 2020-03-01 DIAGNOSIS — Z1231 Encounter for screening mammogram for malignant neoplasm of breast: Secondary | ICD-10-CM

## 2020-12-07 IMAGING — MG MM DIGITAL SCREENING BILAT W/ TOMO W/ CAD
6 of 12 series · 6 of 36 positions shown · non-contrast
Comparison: Previous exam(s).

CLINICAL DATA: Screening.

EXAM:
DIGITAL SCREENING BILATERAL MAMMOGRAM WITH TOMO AND CAD

[L MLO synth-2D]
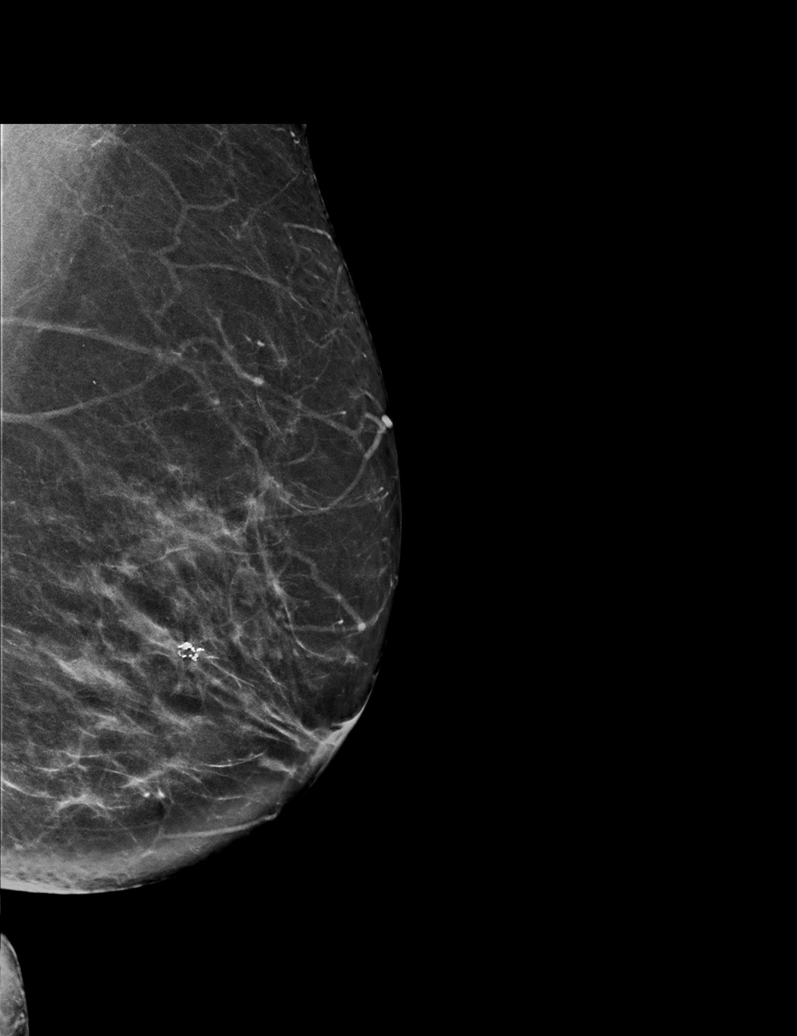

[R MLO synth-2D (1 of 2)]
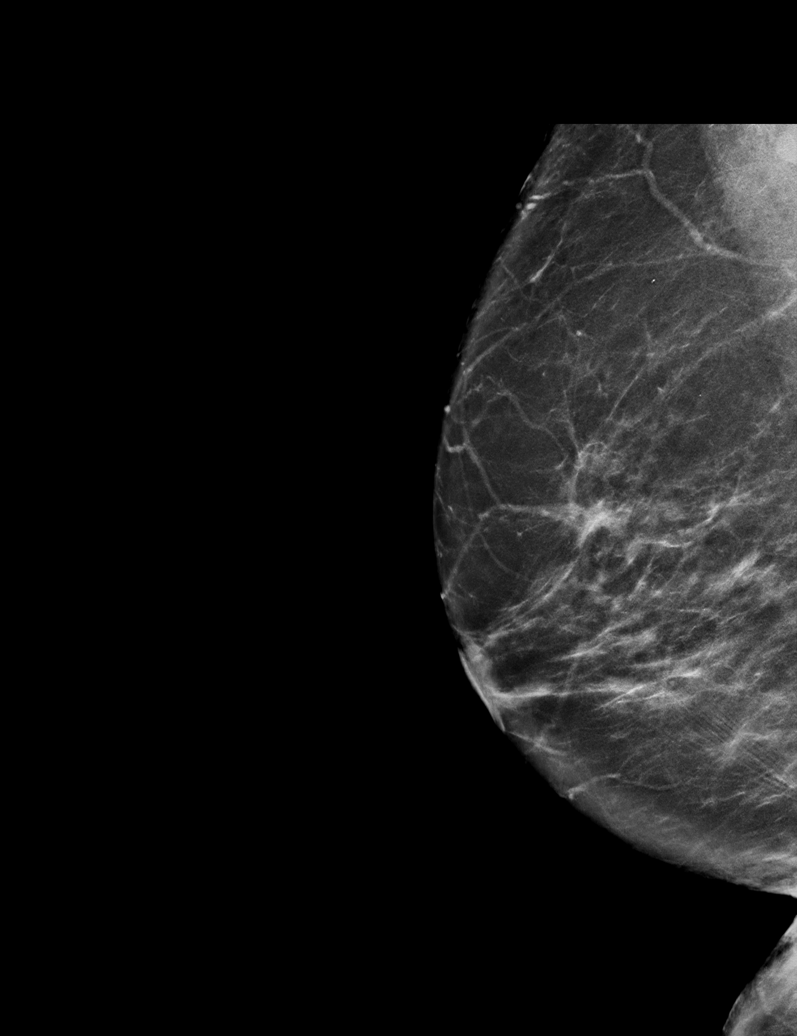

[R CC synth-2D (1 of 2)]
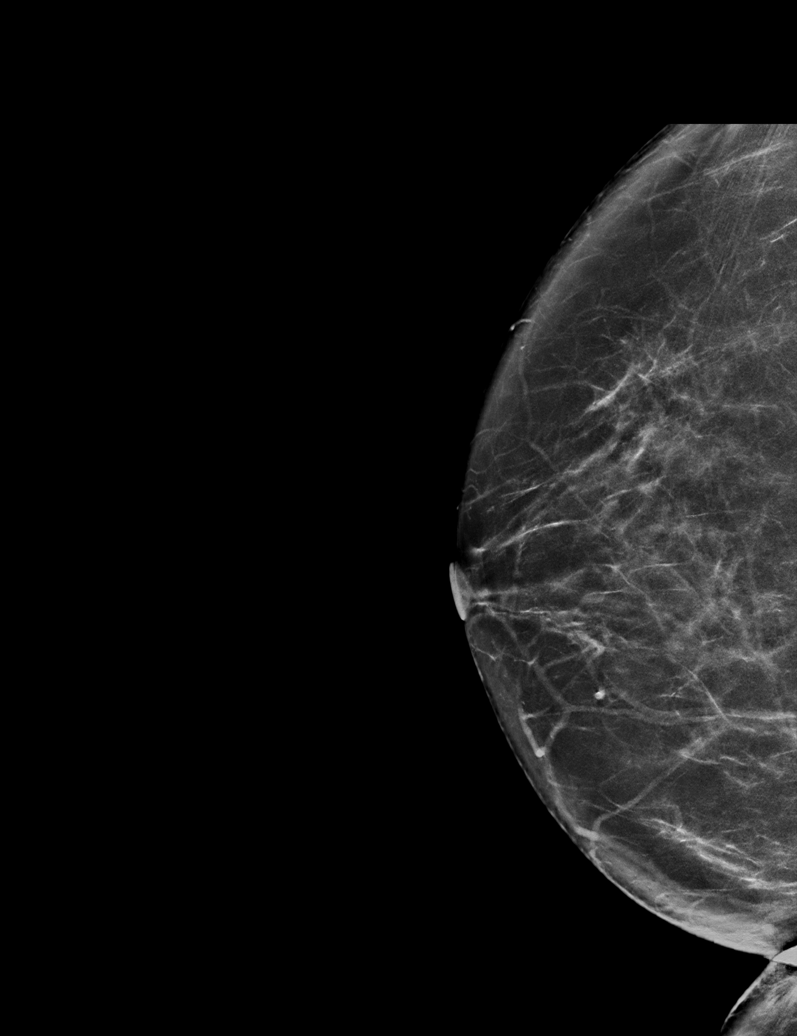

[R CC synth-2D (2 of 2)]
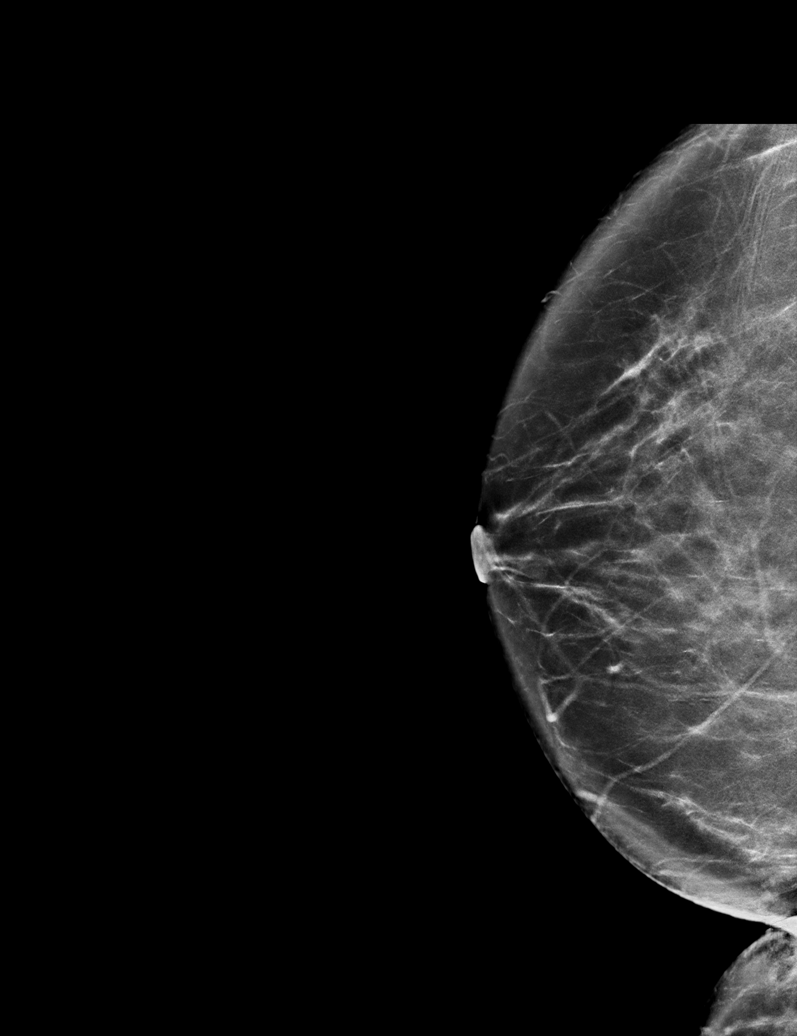

[L CC synth-2D]
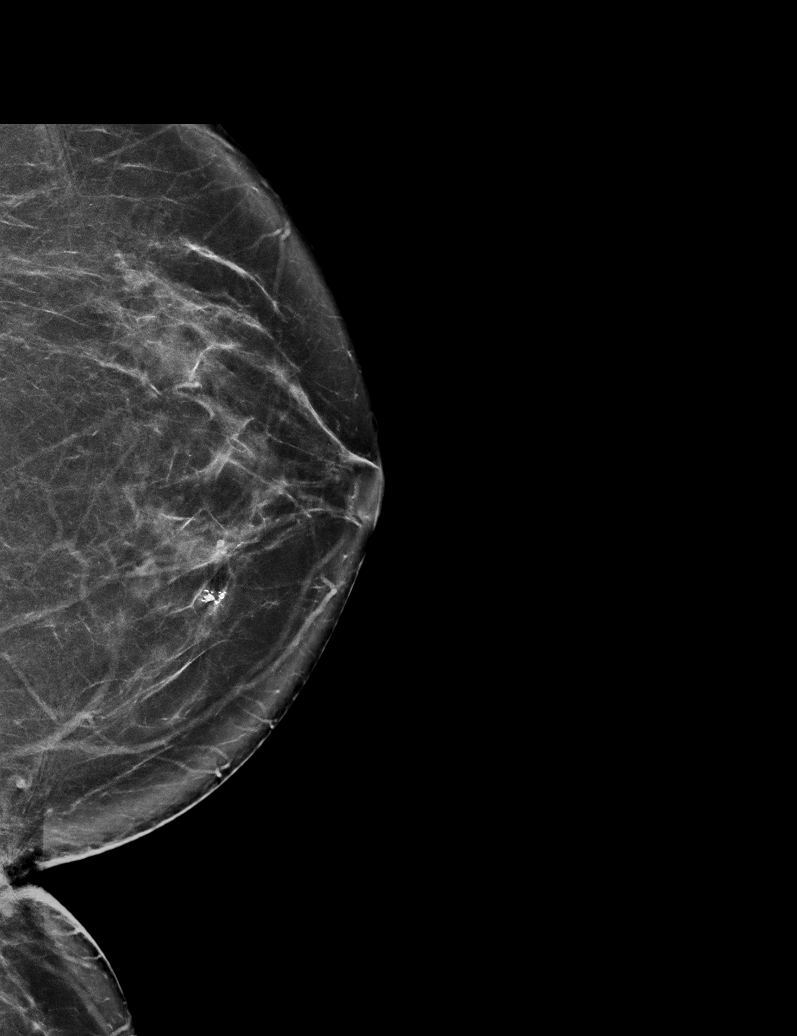

[R MLO synth-2D (2 of 2)]
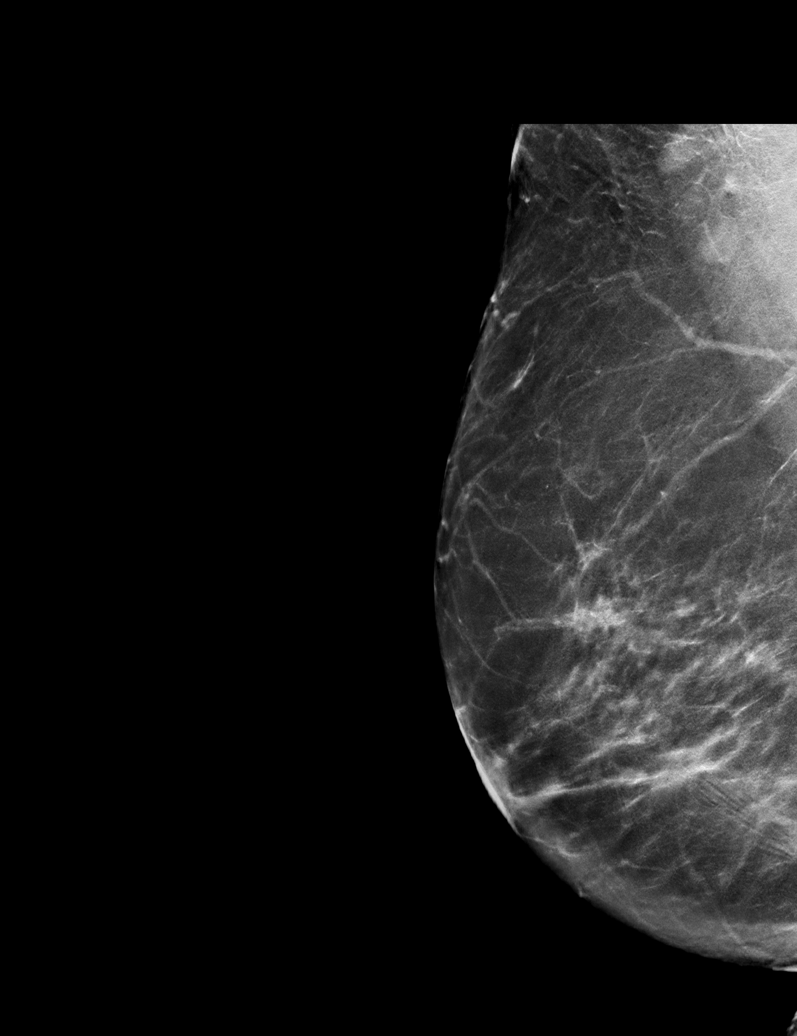

[6 of 36 positions shown; findings below may reference images not displayed]

ACR Breast Density Category b: There are scattered areas of
fibroglandular density.
FINDINGS: There are no findings suspicious for malignancy. Images were
processed with CAD.
IMPRESSION: No mammographic evidence of malignancy. A result letter of this
screening mammogram will be mailed directly to the patient.

RECOMMENDATION:
Screening mammogram in one year. (Code:[id number])

BI-RADS CATEGORY  1: Negative.

## 2021-01-22 ENCOUNTER — Other Ambulatory Visit: Payer: Self-pay | Admitting: Family Medicine

## 2021-01-22 DIAGNOSIS — Z1231 Encounter for screening mammogram for malignant neoplasm of breast: Secondary | ICD-10-CM

## 2021-03-07 ENCOUNTER — Other Ambulatory Visit: Payer: Self-pay

## 2021-03-07 ENCOUNTER — Ambulatory Visit
Admission: RE | Admit: 2021-03-07 | Discharge: 2021-03-07 | Disposition: A | Discharge status: Home / Self Care | Payer: Medicare Other | Source: Ambulatory Visit | Attending: Family Medicine | Admitting: Family Medicine

## 2021-03-07 DIAGNOSIS — Z1231 Encounter for screening mammogram for malignant neoplasm of breast: Secondary | ICD-10-CM | POA: Insufficient documentation

## 2021-03-12 ENCOUNTER — Other Ambulatory Visit: Payer: Self-pay | Admitting: Family Medicine

## 2021-03-12 DIAGNOSIS — N6452 Nipple discharge: Secondary | ICD-10-CM

## 2021-03-20 ENCOUNTER — Other Ambulatory Visit: Payer: Self-pay

## 2021-03-20 ENCOUNTER — Ambulatory Visit
Admission: RE | Admit: 2021-03-20 | Discharge: 2021-03-20 | Disposition: A | Discharge status: Home / Self Care | Payer: Medicare Other | Source: Ambulatory Visit | Attending: Family Medicine | Admitting: Family Medicine

## 2021-03-20 DIAGNOSIS — N6452 Nipple discharge: Secondary | ICD-10-CM

## 2021-12-08 IMAGING — MG DIGITAL SCREENING BILAT W/ TOMO W/ CAD
6 of 12 series · 6 of 36 positions shown · non-contrast
Comparison: Previous exam(s).

CLINICAL DATA: Screening.

EXAM:
DIGITAL SCREENING BILATERAL MAMMOGRAM WITH TOMO AND CAD

[R MLO synth-2D (1 of 2)]
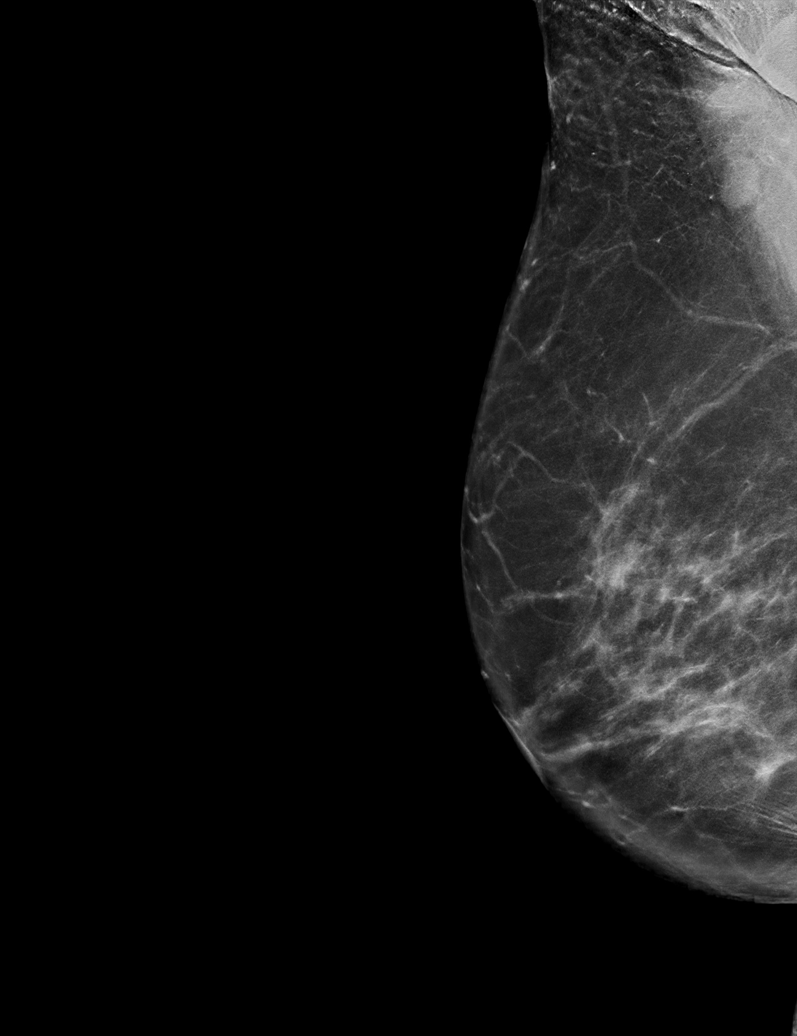

[R CC synth-2D (1 of 2)]
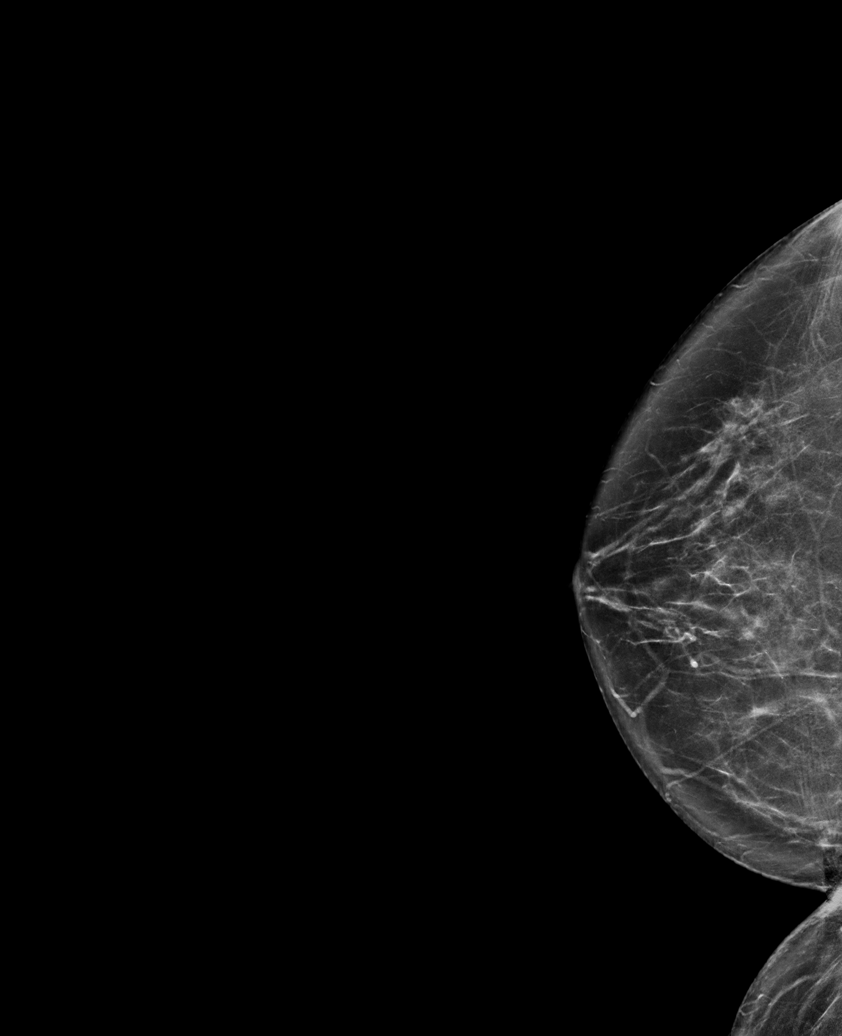

[R CC synth-2D (2 of 2)]
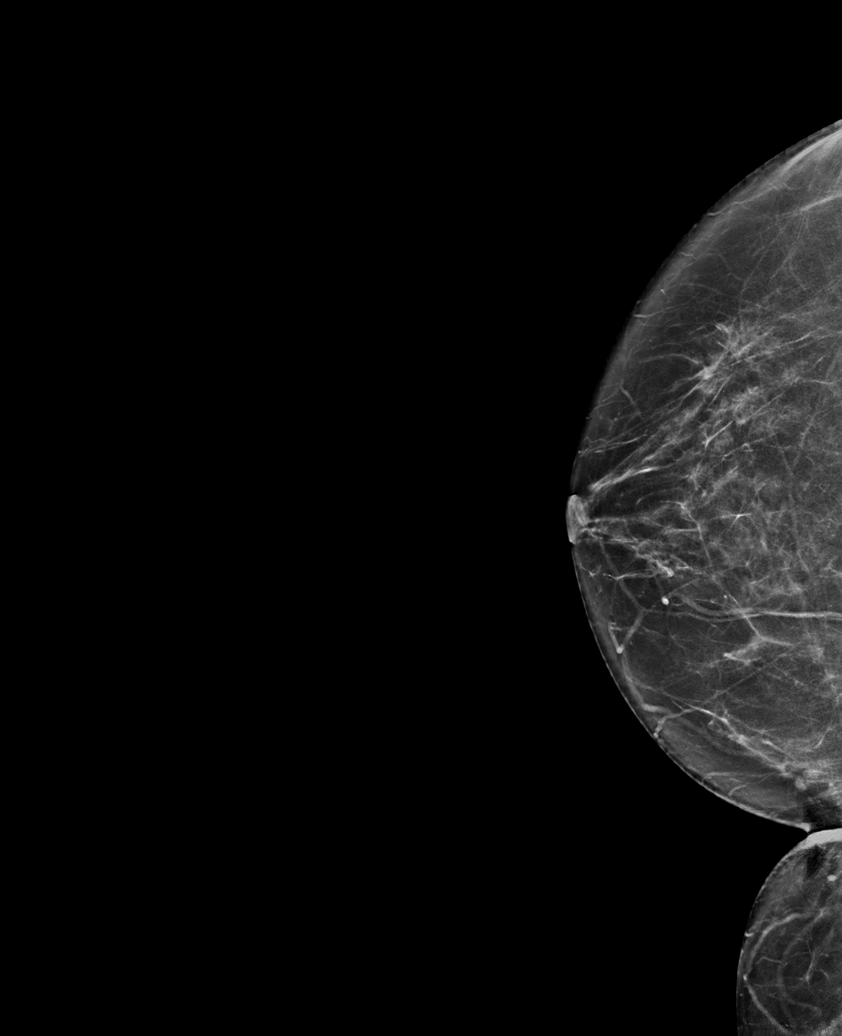

[R MLO synth-2D (2 of 2)]
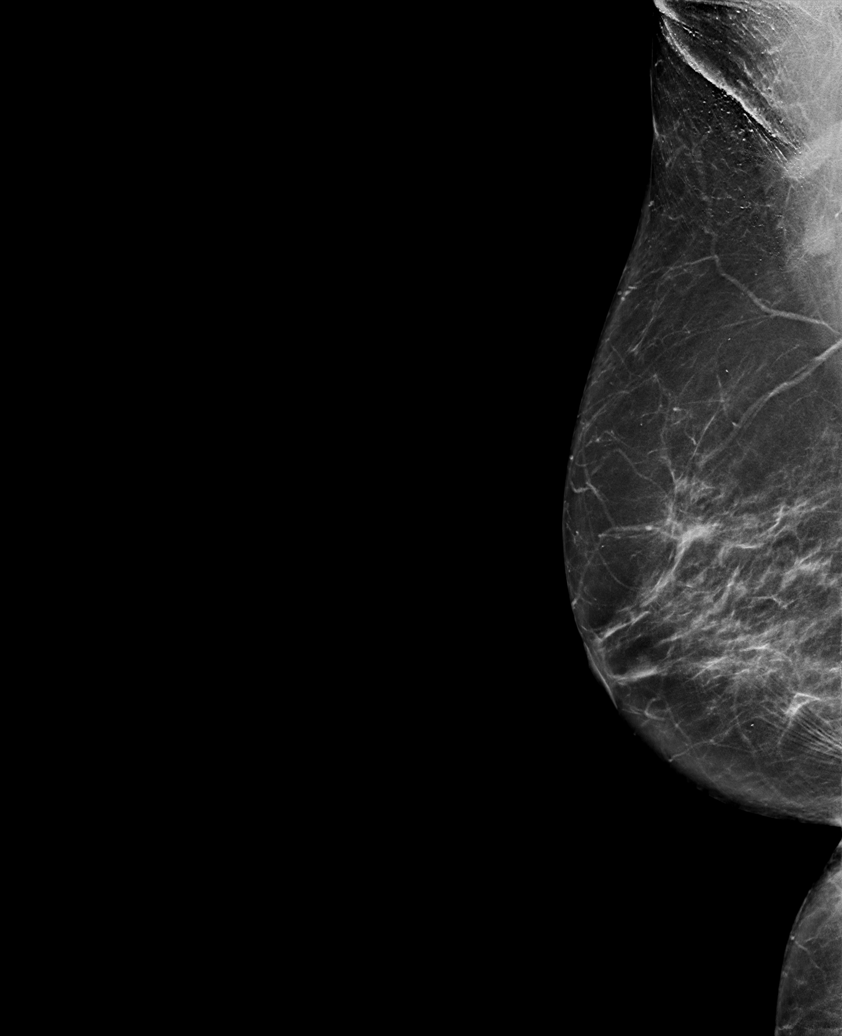

[L CC synth-2D]
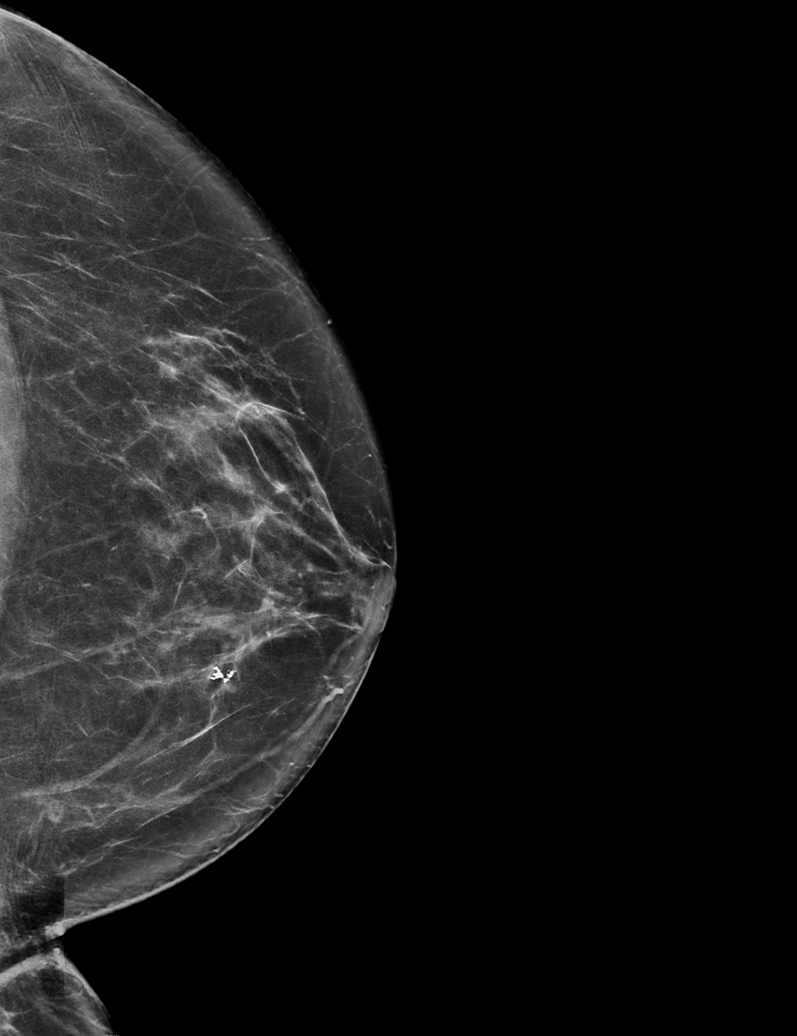

[L MLO synth-2D]
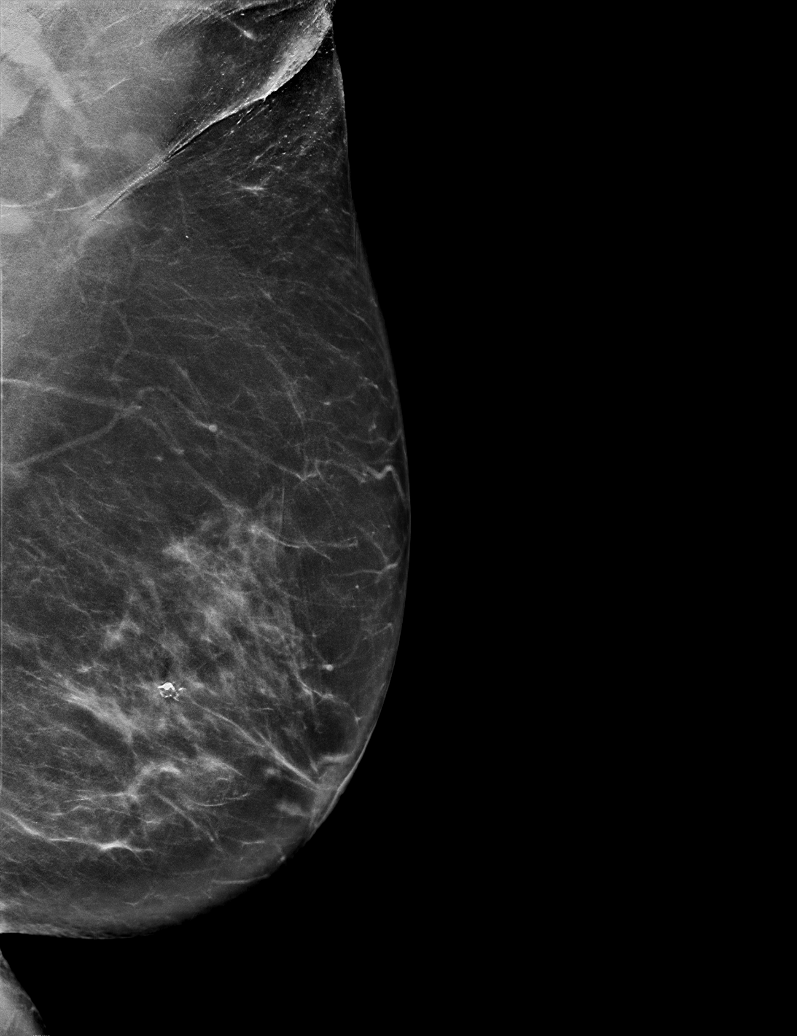

[6 of 36 positions shown; findings below may reference images not displayed]

ACR Breast Density Category b: There are scattered areas of
fibroglandular density.
FINDINGS: There are no findings suspicious for malignancy. Images were
processed with CAD.
IMPRESSION: No mammographic evidence of malignancy. A result letter of this
screening mammogram will be mailed directly to the patient.

RECOMMENDATION:
Screening mammogram in one year. (Code:[id number])

BI-RADS CATEGORY  1: Negative.

## 2022-02-10 ENCOUNTER — Other Ambulatory Visit: Payer: Self-pay | Admitting: Family Medicine

## 2022-02-10 DIAGNOSIS — Z1231 Encounter for screening mammogram for malignant neoplasm of breast: Secondary | ICD-10-CM

## 2022-03-21 ENCOUNTER — Ambulatory Visit
Admission: RE | Admit: 2022-03-21 | Discharge: 2022-03-21 | Disposition: A | Discharge status: Home / Self Care | Payer: Medicare Other | Source: Ambulatory Visit | Attending: Family Medicine | Admitting: Family Medicine

## 2022-03-21 DIAGNOSIS — Z1231 Encounter for screening mammogram for malignant neoplasm of breast: Secondary | ICD-10-CM

## 2022-07-31 ENCOUNTER — Encounter: Payer: Self-pay | Admitting: Cardiology

## 2022-07-31 ENCOUNTER — Ambulatory Visit: Payer: Medicare Other | Attending: Cardiology | Admitting: Cardiology

## 2022-07-31 ENCOUNTER — Other Ambulatory Visit
Admission: RE | Admit: 2022-07-31 | Discharge: 2022-07-31 | Disposition: A | Discharge status: Home / Self Care | Payer: Medicare Other | Attending: Cardiology | Admitting: Cardiology

## 2022-07-31 VITALS — BP 120/70 | HR 64 | Ht 61.5 in | Wt 139.2 lb

## 2022-07-31 DIAGNOSIS — R0609 Other forms of dyspnea: Secondary | ICD-10-CM | POA: Diagnosis present

## 2022-07-31 DIAGNOSIS — I2089 Other forms of angina pectoris: Secondary | ICD-10-CM

## 2022-07-31 DIAGNOSIS — I10 Essential (primary) hypertension: Secondary | ICD-10-CM | POA: Diagnosis not present

## 2022-07-31 DIAGNOSIS — E7800 Pure hypercholesterolemia, unspecified: Secondary | ICD-10-CM

## 2022-07-31 LAB — BASIC METABOLIC PANEL
Anion gap: 7 (ref 5–15)
BUN: 20 mg/dL (ref 8–23)
CO2: 25 mmol/L (ref 22–32)
Calcium: 9.4 mg/dL (ref 8.9–10.3)
Chloride: 110 mmol/L (ref 98–111)
Creatinine, Ser: 1.11 mg/dL — ABNORMAL HIGH (ref 0.44–1.00)
GFR, Estimated: 52 mL/min — ABNORMAL LOW (ref >60)
Glucose, Bld: 105 mg/dL — ABNORMAL HIGH (ref 70–99)
Potassium: 3.6 mmol/L (ref 3.5–5.1)
Sodium: 142 mmol/L (ref 135–145)

## 2022-07-31 MED ORDER — METOPROLOL TARTRATE 100 MG PO TABS: 100.0000 mg | ORAL_TABLET | Freq: Once | ORAL | 0 refills | Status: DC

## 2022-07-31 NOTE — Patient Instructions (Signed)
Medication Instructions:   Your physician recommends that you continue on your current medications as directed. Please refer to the Current Medication list given to you today.  *If you need a refill on your cardiac medications before your next appointment, please call your pharmacy*   Lab Work:  Your physician recommends you go to the medical mall for lab work.   If you have labs (blood work) drawn today and your tests are completely normal, you will receive your results only by: [place] (if you have MyChart) OR A paper copy in the mail If you have any lab test that is abnormal or we need to change your treatment, we will call you to review the results.   Testing/Procedures:  Your physician has requested that you have an echocardiogram. Echocardiography is a painless test that uses sound waves to create images of your heart. It provides your doctor with information about the size and shape of your heart and how well your heart's chambers and valves are working. This procedure takes approximately one hour. There are no restrictions for this procedure. Please do NOT wear cologne, perfume, aftershave, or lotions (deodorant is allowed). Please arrive 15 minutes prior to your appointment time.    Your cardiac CT will be scheduled on 08/14/2022 @ 1:15 pm [facility] [address] [place], [place] [address] [phone]  If scheduled at [facility] or [facility], please arrive 15 mins early for check-in and test prep.   Please follow these instructions carefully (unless otherwise directed):  On the Night Before the Test: Be sure to Drink plenty of water. Do not consume any caffeinated/decaffeinated beverages or chocolate 12 hours prior to your test. Do not take any antihistamines 12 hours prior to your test.  On the Day of the Test: Drink plenty of water until 1 hour prior to the  test. Do not eat any food 1 hour prior to test. You may take your regular medications prior to the test.  Take metoprolol (Lopressor) two hours prior to test. FEMALES- please wear underwire-free bra if available, avoid dresses & tight clothing  After the Test: Drink plenty of water. After receiving IV contrast, you may experience a mild flushed feeling. This is normal. On occasion, you may experience a mild rash up to 24 hours after the test. This is not dangerous. If this occurs, you can take Benadryl 25 mg and increase your fluid intake. If you experience trouble breathing, this can be serious. If it is severe call 911 IMMEDIATELY. If it is mild, please call our office.  For scheduling needs, including cancellations and rescheduling, please call [place], [phone].   Follow-Up: At [facility], you and your health needs are our priority.  As part of our continuing mission to provide you with exceptional heart care, we have created designated Provider Care Teams.  These Care Teams include your primary Cardiologist (physician) and Advanced Practice Providers (APPs -  Physician Assistants and Nurse Practitioners) who all work together to provide you with the care you need, when you need it.  We recommend signing up for the patient portal called "MyChart".  Sign up information is provided on this After Visit Summary.  MyChart is used to connect with patients for Virtual Visits (Telemedicine).  Patients are able to view lab/test results, encounter notes, upcoming appointments, etc.  Non-urgent messages can be sent to your provider as well.   To learn more about what you can  do with MyChart, go to [email or web address].    Your next appointment:    After testing  Provider:   You may see Kate Sable, MD or one of the following Advanced Practice Providers on your designated Care Team:   Murray Hodgkins, NP Christell Faith, PA-C Cadence Kathlen Mody, PA-C Gerrie Nordmann, NP

## 2022-07-31 NOTE — Progress Notes (Signed)
Cardiology Office Note:    Date:  07/31/2022   ID:  Hannah Larsen, DOB [birth year], MRN [id number]  PCP:  [place] Pink, [facility] Cardiologist:  Kate Sable, MD     Referring MD: [place] Pink, MD   Chief Complaint  Patient presents with   New Patient (Initial Visit)    SOB, left ankle swelling    History of Present Illness:    Hannah Larsen is a 77 y.o. female with a hx of hypertension, hyperlipidemia, former smoker x 50+ years who presents with shortness of breath with exertion.  Symptoms of shortness of breath started 2 months ago after she was diagnosed with strep throat.  She was given Augmentin which causes severe reaction/rash.  Since then, she states being more out of breath with minimal exertion.  Typically has to sit in her car about 5 to 10 minutes to catch her breath after grocery shopping.  Denies chest pain.  Denies edema.  Denies any family history of heart attacks.  Compliant with medications as prescribed.  History reviewed. No pertinent past medical history.  History reviewed. No pertinent surgical history.  Current Medications: Current Meds  Medication Sig   acyclovir (ZOVIRAX) 400 MG tablet Take 1 tablet by mouth 3 (three) times daily.   amLODipine (NORVASC) 10 MG tablet Take 10 mg by mouth daily.   ASPIRIN 81 PO Take 81 mg by mouth once a week.   carvedilol (COREG) 25 MG tablet Take 25 mg by mouth 2 (two) times daily.   clobetasol ointment (TEMOVATE) 2.67 % Apply 1 Application topically 2 (two) times daily.   losartan (COZAAR) 100 MG tablet Take 100 mg by mouth daily.   metoprolol tartrate (LOPRESSOR) 100 MG tablet Take 1 tablet (100 mg total) by mouth once for 1 dose. Two hours prior to Cardiac CTA   oxybutynin (DITROPAN) 5 MG tablet Take 5 mg by mouth daily.   simvastatin (ZOCOR) 40 MG tablet Take 40 mg by mouth at bedtime.     Allergies:   Levocetirizine and Amoxicillin-pot clavulanate   Social History    Socioeconomic History   Marital status: Married    Spouse name: Not on file   Number of children: Not on file   Years of education: Not on file   Highest education level: Not on file  Occupational History   Not on file  Tobacco Use   Smoking status: Former    Packs/day: 0.75    Types: Cigarettes    Quit date: 2018    Years since quitting: 6.0   Smokeless tobacco: Never  Substance and Sexual Activity   Alcohol use: Never   Drug use: Never   Sexual activity: Not on file  Other Topics Concern   Not on file  Social History Narrative   Not on file   Social Determinants of Health   Financial Resource Strain: Not on file  Food Insecurity: Not on file  Transportation Needs: Not on file  Physical Activity: Not on file  Stress: Not on file  Social Connections: Not on file     Family History: The patient's family history is negative for Breast cancer.  ROS:   Please see the history of present illness.     All other systems reviewed and are negative.  EKGs/Labs/Other Studies Reviewed:    The following studies were reviewed today:   EKG:  EKG is  ordered today.  The ekg ordered today demonstrates normal sinus rhythm  Recent  Labs: No results found for requested labs within last 365 days.  Recent Lipid Panel No results found for: "CHOL", "TRIG", "HDL", "CHOLHDL", "VLDL", "LDLCALC", "LDLDIRECT"   Risk Assessment/Calculations:             Physical Exam:    VS:  BP 120/70 (BP Location: Right Arm)   Pulse 64   Ht 5' 1.5" (1.562 m)   Wt 139 lb 3.2 oz (63.1 kg)   SpO2 94%   BMI 25.88 kg/m     Wt Readings from Last 3 Encounters:  07/31/22 139 lb 3.2 oz (63.1 kg)     GEN:  Well nourished, well developed in no acute distress HEENT: Normal NECK: No JVD; No carotid bruits CARDIAC: RRR, no murmurs, rubs, gallops RESPIRATORY:  Clear to auscultation without rales, wheezing or rhonchi  ABDOMEN: Soft, non-tender, non-distended MUSCULOSKELETAL:  No edema; No  deformity  SKIN: Warm and dry NEUROLOGIC:  Alert and oriented x 3 PSYCHIATRIC:  Normal affect   ASSESSMENT:    1. Dyspnea on exertion   2. Primary hypertension   3. Pure hypercholesterolemia   4. Anginal equivalent    PLAN:    In order of problems listed above:  Dyspnea on exertion, anginal equivalent.  Risk factors hypertension, former smoker.  Get echo, get coronary CTA.  If cardiac testing is unrevealing, consider pulmonary workup due to over 50+ years of smoking. Hypertension, BP controlled.  Continue Norvasc, losartan, Coreg. Hyperlipidemia, statin.  Follow-up after cardiac testing.     Medication Adjustments/Labs and Tests Ordered: Current medicines are reviewed at length with the patient today.  Concerns regarding medicines are outlined above.  Orders Placed This Encounter  Procedures   CT CORONARY MORPH W/CTA COR W/SCORE W/CA W/CM &/OR WO/CM   Basic Metabolic Panel (BMET)   EKG 12-Lead   ECHOCARDIOGRAM COMPLETE   Meds ordered this encounter  Medications   metoprolol tartrate (LOPRESSOR) 100 MG tablet    Sig: Take 1 tablet (100 mg total) by mouth once for 1 dose. Two hours prior to Cardiac CTA    Dispense:  1 tablet    Refill:  0    Patient Instructions  Medication Instructions:   Your physician recommends that you continue on your current medications as directed. Please refer to the Current Medication list given to you today.  *If you need a refill on your cardiac medications before your next appointment, please call your pharmacy*   Lab Work:  Your physician recommends you go to the medical mall for lab work.   If you have labs (blood work) drawn today and your tests are completely normal, you will receive your results only by: [place] (if you have MyChart) OR A paper copy in the mail If you have any lab test that is abnormal or we need to change your treatment, we will call you to review the results.   Testing/Procedures:  Your physician  has requested that you have an echocardiogram. Echocardiography is a painless test that uses sound waves to create images of your heart. It provides your doctor with information about the size and shape of your heart and how well your heart's chambers and valves are working. This procedure takes approximately one hour. There are no restrictions for this procedure. Please do NOT wear cologne, perfume, aftershave, or lotions (deodorant is allowed). Please arrive 15 minutes prior to your appointment time.    Your cardiac CT will be scheduled on 08/14/2022 @ 1:15 pm [facility] [address] [place],  [address] [phone]  If scheduled at [facility] or [facility] please arrive 15 mins early for check-in and test prep.   Please follow these instructions carefully (unless otherwise directed):  On the Night Before the Test: Be sure to Drink plenty of water. Do not consume any caffeinated/decaffeinated beverages or chocolate 12 hours prior to your test. Do not take any antihistamines 12 hours prior to your test.  On the Day of the Test: Drink plenty of water until 1 hour prior to the test. Do not eat any food 1 hour prior to test. You may take your regular medications prior to the test.  Take metoprolol (Lopressor) two hours prior to test. FEMALES- please wear underwire-free bra if available, avoid dresses & tight clothing  After the Test: Drink plenty of water. After receiving IV contrast, you may experience a mild flushed feeling. This is normal. On occasion, you may experience a mild rash up to 24 hours after the test. This is not dangerous. If this occurs, you can take Benadryl 25 mg and increase your fluid intake. If you experience trouble breathing, this can be serious. If it is severe call 911 IMMEDIATELY. If it is mild, please call our office.  For scheduling needs, including  cancellations and rescheduling, please call [place], [phone].   Follow-Up: At [facility], you and your health needs are our priority.  As part of our continuing mission to provide you with exceptional heart care, we have created designated Provider Care Teams.  These Care Teams include your primary Cardiologist (physician) and Advanced Practice Providers (APPs -  Physician Assistants and Nurse Practitioners) who all work together to provide you with the care you need, when you need it.  We recommend signing up for the patient portal called "MyChart".  Sign up information is provided on this After Visit Summary.  MyChart is used to connect with patients for Virtual Visits (Telemedicine).  Patients are able to view lab/test results, encounter notes, upcoming appointments, etc.  Non-urgent messages can be sent to your provider as well.   To learn more about what you can do with MyChart, go to [email or web address].    Your next appointment:    After testing  Provider:   You may see Kate Sable, MD or one of the following Advanced Practice Providers on your designated Care Team:   Murray Hodgkins, NP Christell Faith, PA-C Cadence Kathlen Mody, PA-C Gerrie Nordmann, NP    Signed, Kate Sable, MD  07/31/2022 1:04 PM    [place]

## 2022-08-12 ENCOUNTER — Telehealth (HOSPITAL_COMMUNITY): Payer: Self-pay | Admitting: *Deleted

## 2022-08-12 NOTE — Telephone Encounter (Signed)
Reaching out to patient to offer assistance regarding upcoming cardiac imaging study; pt verbalizes understanding of appt date/time, parking situation and where to check in, pre-test NPO status and medications ordered, and verified current allergies; name and call back number provided for further questions should they arise  Gordy Clement RN Navigator [facility] [phone] office [phone] cell  Patient to hold her daily carvedilol and take 134m metoprolol tartrate two hours prior to her cardiac CT scan.

## 2022-08-14 ENCOUNTER — Ambulatory Visit
Admission: RE | Admit: 2022-08-14 | Discharge: 2022-08-14 | Disposition: A | Discharge status: Home / Self Care | Payer: Medicare Other | Source: Ambulatory Visit | Attending: Cardiology | Admitting: Cardiology

## 2022-08-14 DIAGNOSIS — J439 Emphysema, unspecified: Secondary | ICD-10-CM | POA: Insufficient documentation

## 2022-08-14 DIAGNOSIS — I2510 Atherosclerotic heart disease of native coronary artery without angina pectoris: Secondary | ICD-10-CM | POA: Diagnosis not present

## 2022-08-14 DIAGNOSIS — R0609 Other forms of dyspnea: Secondary | ICD-10-CM | POA: Insufficient documentation

## 2022-08-14 DIAGNOSIS — I3139 Other pericardial effusion (noninflammatory): Secondary | ICD-10-CM | POA: Diagnosis not present

## 2022-08-14 HISTORY — DX: Essential (primary) hypertension: I10

## 2022-08-14 MED ORDER — NITROGLYCERIN 0.4 MG SL SUBL
0.8000 mg | SUBLINGUAL_TABLET | Freq: Once | SUBLINGUAL | Status: AC
  Administered 2022-08-14: 0.8 mg via SUBLINGUAL

## 2022-08-14 MED ORDER — IOHEXOL 350 MG/ML SOLN
75.0000 mL | Freq: Once | INTRAVENOUS | Status: AC | PRN
  Administered 2022-08-14: 75 mL via INTRAVENOUS

## 2022-08-14 MED ORDER — METOPROLOL TARTRATE 5 MG/5ML IV SOLN: 10.0000 mg | Freq: Once | INTRAVENOUS | Status: DC

## 2022-08-14 MED ORDER — METOPROLOL TARTRATE 5 MG/5ML IV SOLN
10.0000 mg | Freq: Once | INTRAVENOUS | Status: AC
  Administered 2022-08-14: 10 mg via INTRAVENOUS

## 2022-08-14 NOTE — Progress Notes (Signed)
Patient tolerated procedure well. Ambulate w/o difficulty. Denies light headedness or being dizzy. Sitting in chair drinking water provided. Encouraged to drink extra water today and reasoning explained. Verbalized understanding. All questions answered. ABC intact. No further needs. Discharge from procedure area w/o issues.   °

## 2022-08-15 ENCOUNTER — Telehealth: Payer: Self-pay | Admitting: Cardiology

## 2022-08-15 MED ORDER — ATORVASTATIN CALCIUM 40 MG PO TABS: 40.0000 mg | ORAL_TABLET | Freq: Every day | ORAL | 3 refills | Status: DC

## 2022-08-15 NOTE — Telephone Encounter (Signed)
-----   Message from Kate Sable, MD sent at 08/14/2022  5:11 PM EST ----- Mild nonobstructive coronary artery disease.  No findings to suggest etiology of shortness of breath.  Continue aspirin 81 mg.  Stop simvastatin.  Start Lipitor 40 mg daily.  Obtain echo as scheduled.

## 2022-08-15 NOTE — Telephone Encounter (Signed)
Reviewed results with patient.  Pharmacy verified.  Medication list updated and medication sent.   The patient has been notified of the result and verbalized understanding.  All questions (if any) were answered. Janan Ridge, [place] 08/15/2022 11:18 AM

## 2022-08-15 NOTE — Telephone Encounter (Signed)
Pt returning call regarding test results. Please advise ?

## 2022-08-18 ENCOUNTER — Other Ambulatory Visit: Payer: Self-pay

## 2022-08-18 MED ORDER — ATORVASTATIN CALCIUM 40 MG PO TABS: 40.0000 mg | ORAL_TABLET | Freq: Every day | ORAL | 0 refills | Status: DC

## 2022-09-05 ENCOUNTER — Ambulatory Visit: Payer: Medicare Other | Attending: Cardiology

## 2022-09-05 DIAGNOSIS — R0609 Other forms of dyspnea: Secondary | ICD-10-CM

## 2022-09-06 LAB — ECHOCARDIOGRAM COMPLETE
Area-P 1/2: 3.85 cm2
S' Lateral: 2.6 cm

## 2022-09-23 ENCOUNTER — Encounter: Payer: Self-pay | Admitting: Cardiology

## 2022-09-23 ENCOUNTER — Ambulatory Visit: Payer: Medicare Other | Attending: Cardiology | Admitting: Cardiology

## 2022-09-23 VITALS — BP 120/74 | HR 64 | Ht 60.0 in | Wt 137.0 lb

## 2022-09-23 DIAGNOSIS — R911 Solitary pulmonary nodule: Secondary | ICD-10-CM

## 2022-09-23 DIAGNOSIS — J439 Emphysema, unspecified: Secondary | ICD-10-CM | POA: Diagnosis not present

## 2022-09-23 DIAGNOSIS — E7800 Pure hypercholesterolemia, unspecified: Secondary | ICD-10-CM

## 2022-09-23 DIAGNOSIS — I10 Essential (primary) hypertension: Secondary | ICD-10-CM | POA: Diagnosis not present

## 2022-09-23 DIAGNOSIS — I2510 Atherosclerotic heart disease of native coronary artery without angina pectoris: Secondary | ICD-10-CM

## 2022-09-23 NOTE — Patient Instructions (Signed)
Medication Instructions:   Your physician recommends that you continue on your current medications as directed. Please refer to the Current Medication list given to you today.  *If you need a refill on your cardiac medications before your next appointment, please call your pharmacy*   Lab Work:  None Ordered  If you have labs (blood work) drawn today and your tests are completely normal, you will receive your results only by: MyChart Message (if you have MyChart) OR A paper copy in the mail If you have any lab test that is abnormal or we need to change your treatment, we will call you to review the results.   Testing/Procedures:  None Ordered   Follow-Up: At [facility], you and your health needs are our priority.  As part of our continuing mission to provide you with exceptional heart care, we have created designated Provider Care Teams.  These Care Teams include your primary Cardiologist (physician) and Advanced Practice Providers (APPs -  Physician Assistants and Nurse Practitioners) who all work together to provide you with the care you need, when you need it.  We recommend signing up for the patient portal called "MyChart".  Sign up information is provided on this After Visit Summary.  MyChart is used to connect with patients for Virtual Visits (Telemedicine).  Patients are able to view lab/test results, encounter notes, upcoming appointments, etc.  Non-urgent messages can be sent to your provider as well.   To learn more about what you can do with MyChart, go to [email or web address].    Your next appointment:   6 - 12 month(s)  Provider:   You may see Brian Agbor-Etang, MD or one of the following Advanced Practice Providers on your designated Care Team:   Christopher Berge, NP Ryan Dunn, PA-C Cadence Furth, PA-C Sheri Hammock, NP 

## 2022-09-23 NOTE — Progress Notes (Signed)
Cardiology Office Note:    Date:  09/23/2022   ID:  Hannah Larsen, DOB 07/16/1945, MRN [id number]  PCP:  Maryland Pink, [place] Providers Cardiologist:  Kate Sable, MD     Referring MD: [place] Pink, MD   Chief Complaint  Patient presents with   Follow-up    F/U after testing    History of Present Illness:    Hannah Larsen is a 77 y.o. female with a hx of hypertension, hyperlipidemia, former smoker x 50+ years who presents for follow-up.  Previously seen with shortness of breath with exertion.  Echo and coronary CTA was obtained to evaluate cardiac etiology.  BP adequately controlled.  Presents for testing results.  She takes aspirin daily, notes easy bruisability.  Coronary CT 2/24 revealed mild nonobstructive CAD in proximal LAD and RCA.  6 mm nodule noted in the lingula, emphysematous scarring also noted on CT.  Simvastatin was switched to Lipitor 40 mg daily.  Patient tolerating statin with no adverse effects.  Denies chest pain, still with shortness of breath.   Past Medical History:  Diagnosis Date   Hypertension     History reviewed. No pertinent surgical history.  Current Medications: Current Meds  Medication Sig   acyclovir (ZOVIRAX) 400 MG tablet Take 1 tablet by mouth 3 (three) times daily.   amLODipine (NORVASC) 10 MG tablet Take 10 mg by mouth daily.   ASPIRIN 81 PO Take 81 mg by mouth once a week.   atorvastatin (LIPITOR) 40 MG tablet Take 1 tablet (40 mg total) by mouth daily.   carvedilol (COREG) 25 MG tablet Take 25 mg by mouth 2 (two) times daily.   clobetasol ointment (TEMOVATE) [id number] % Apply 1 Application topically 2 (two) times daily.   losartan (COZAAR) 100 MG tablet Take 100 mg by mouth daily.   oxybutynin (DITROPAN) 5 MG tablet Take 5 mg by mouth daily.     Allergies:   Benadryl [diphenhydramine], Levocetirizine, and Amoxicillin-pot clavulanate   Social History   Socioeconomic History   Marital status:  Married    Spouse name: Not on file   Number of children: Not on file   Years of education: Not on file   Highest education level: Not on file  Occupational History   Not on file  Tobacco Use   Smoking status: Former    Packs/day: .73    Types: Cigarettes    Quit date: 2018    Years since quitting: 6.2   Smokeless tobacco: Never  Vaping Use   Vaping Use: Never used  Substance and Sexual Activity   Alcohol use: Never   Drug use: Never   Sexual activity: Not on file  Other Topics Concern   Not on file  Social History Narrative   Not on file   Social Determinants of Health   Financial Resource Strain: Not on file  Food Insecurity: Not on file  Transportation Needs: Not on file  Physical Activity: Not on file  Stress: Not on file  Social Connections: Not on file     Family History: The patient's family history is negative for Breast cancer.  ROS:   Please see the history of present illness.     All other systems reviewed and are negative.  EKGs/Labs/Other Studies Reviewed:    The following studies were reviewed today:   EKG:  EKG not  ordered today.    Recent Labs: 07/31/2022: BUN 20; Creatinine, Ser 1.11; Potassium 3.6; Sodium 142  Recent Lipid Panel No results found for: "CHOL", "TRIG", "HDL", "CHOLHDL", "VLDL", "LDLCALC", "LDLDIRECT"   Risk Assessment/Calculations:             Physical Exam:    VS:  BP 120/74 (BP Location: Left Arm, Patient Position: Sitting, Cuff Size: Normal)   Pulse 64   Ht 5' (1.524 m)   Wt 137 lb (62.1 kg)   SpO2 95%   BMI 26.76 kg/m     Wt Readings from Last 3 Encounters:  09/23/22 137 lb (62.1 kg)  07/31/22 139 lb 3.2 oz (63.1 kg)     GEN:  Well nourished, well developed in no acute distress HEENT: Normal NECK: No JVD; No carotid bruits CARDIAC: RRR, no murmurs, rubs, gallops RESPIRATORY:  Clear to auscultation without rales, wheezing or rhonchi  ABDOMEN: Soft, non-tender, non-distended MUSCULOSKELETAL:  No edema;  No deformity  SKIN: Warm and dry NEUROLOGIC:  Alert and oriented x 3 PSYCHIATRIC:  Normal affect   ASSESSMENT:    1. Coronary artery disease involving native coronary artery of native heart, unspecified whether angina present   2. Primary hypertension   3. Pure hypercholesterolemia   4. Pulmonary emphysema, unspecified emphysema type ([place])   5. Pulmonary nodule     PLAN:    In order of problems listed above:  Nonobstructive CAD, mild proximal LAD and RCA disease.  Calcium score 1388.  Aspirin causes easy bruisability.  EF 60 to 65% start Lipitor 40 mg daily. Hypertension, BP controlled.  Continue Norvasc, losartan, Coreg. Hyperlipidemia, Lipitor 40 mg daily. Emphysema, shortness of breath, refer to pulmonary medicine for management. 6 mm nodule noted in the lingula.  Refer to pulm medicine for monitoring.  Follow-up in 6 to 12 months.     Medication Adjustments/Labs and Tests Ordered: Current medicines are reviewed at length with the patient today.  Concerns regarding medicines are outlined above.  Orders Placed This Encounter  Procedures   Ambulatory referral to Pulmonology   No orders of the defined types were placed in this encounter.   Patient Instructions  Medication Instructions:   Your physician recommends that you continue on your current medications as directed. Please refer to the Current Medication list given to you today.  *If you need a refill on your cardiac medications before your next appointment, please call your pharmacy*   Lab Work:  None Ordered  If you have labs (blood work) drawn today and your tests are completely normal, you will receive your results only by: [place] (if you have MyChart) OR A paper copy in the mail If you have any lab test that is abnormal or we need to change your treatment, we will call you to review the results.   Testing/Procedures:  None Ordered    Follow-Up: At [facility], you and your  health needs are our priority.  As part of our continuing mission to provide you with exceptional heart care, we have created designated Provider Care Teams.  These Care Teams include your primary Cardiologist (physician) and Advanced Practice Providers (APPs -  Physician Assistants and Nurse Practitioners) who all work together to provide you with the care you need, when you need it.  We recommend signing up for the patient portal called "MyChart".  Sign up information is provided on this After Visit Summary.  MyChart is used to connect with patients for Virtual Visits (Telemedicine).  Patients are able to view lab/test results, encounter notes, upcoming appointments, etc.  Non-urgent messages can be sent to your  provider as well.   To learn more about what you can do with MyChart, go to [email or web address].    Your next appointment:   6 - 12 months  Provider:   You may see Kate Sable, MD or one of the following Advanced Practice Providers on your designated Care Team:   Murray Hodgkins, NP Christell Faith, PA-C Cadence Kathlen Mody, PA-C Gerrie Nordmann, NP     Signed, Kate Sable, MD  09/23/2022 9:30 AM    [place]

## 2022-09-25 ENCOUNTER — Telehealth: Payer: Self-pay | Admitting: Cardiology

## 2022-09-25 NOTE — Telephone Encounter (Signed)
Follow Up:      Patient  said she received a letter stating that you were trying to give her results.

## 2022-09-25 NOTE — Telephone Encounter (Signed)
Spoke with patient and reviewed results and recommendations with patient. She verbalized understanding with no further questions at this time.    Kate Sable, MD 09/08/2022  2:45 PM EDT     Echo shows normal systolic function,, impaired relaxation likely from age and history of hypertension.  Continue medications as prescribed.     Please refer patient to pulmonary medicine due to history of smoking and emphysema noted on Cardiac CT.

## 2022-10-09 ENCOUNTER — Institutional Professional Consult (permissible substitution): Payer: Medicare Other | Admitting: Student in an Organized Health Care Education/Training Program

## 2022-10-22 ENCOUNTER — Encounter: Payer: Self-pay | Admitting: Student in an Organized Health Care Education/Training Program

## 2022-10-22 ENCOUNTER — Ambulatory Visit: Payer: Medicare Other | Admitting: Student in an Organized Health Care Education/Training Program

## 2022-10-22 VITALS — BP 124/74 | HR 63 | Temp 98.0°F | Ht 60.0 in | Wt 137.6 lb

## 2022-10-22 DIAGNOSIS — R0602 Shortness of breath: Secondary | ICD-10-CM | POA: Diagnosis not present

## 2022-10-22 DIAGNOSIS — R911 Solitary pulmonary nodule: Secondary | ICD-10-CM | POA: Diagnosis not present

## 2022-10-22 NOTE — Patient Instructions (Signed)
Today, we discussed your symptoms. Given you don't have shortness of breath, we will hold off on further testing of your breathing. If this changes, we can do a breathing test/PFT. Your previous coronary CT showed a small nodule in your left lung, which we should keep an eye on with a repeat CT. Let's aim to have that done in June and I will see you for follow up in July.

## 2022-10-22 NOTE — Progress Notes (Signed)
Synopsis: Referred in for shortness of breath by Debbe Odea, MD  Assessment & Plan:   1. Shortness of breath  Reports that this has resolved. Coronary CT with emphysema and she is at an increased risk for COPD. I did discuss this with the patient today and offered her pulmonary function testing, which she would prefer not to undergo unless her symptoms come back. She does not feel she needs any medications/inhalers for any symptoms at this point. I did inform the patient that her shortness of breath while she was ill does suggest she might have underlying lung disease that could worsen with time. She will let us know if symptoms come back and/or become bothersome.  -consider PFT's in the future  2. Lung nodule  Incidental lingular nodule on coronary CT, 6 mm, that will need to be followed. I will obtain a dedicated chest CT to follow this. Should this grow in size and/or not resolve, we might consider biopsy. Should it resolve, I will refer the patient to our lung cancer screening program.  - CT CHEST WO CONTRAST; Future   Return in about 3 months (around 01/21/2023).  I spent 45 minutes caring for this patient today, including preparing to see the patient, obtaining a medical history , reviewing a separately obtained history, performing a medically appropriate examination and/or evaluation, counseling and educating the patient/family/caregiver, ordering medications, tests, or procedures, documenting clinical information in the electronic health record, and independently interpreting results (not separately reported/billed) and communicating results to the patient/family/caregiver  Raechel Chute, MD [facility] 10/22/2022 9:40 AM    End of visit medications:  No orders of the defined types were placed in this encounter.    Current Outpatient Medications:    acyclovir (ZOVIRAX) 400 MG tablet, Take 1 tablet by mouth 3 (three) times daily., Disp: , Rfl:     amLODipine (NORVASC) 10 MG tablet, Take 10 mg by mouth daily., Disp: , Rfl:    ASPIRIN 81 PO, Take 81 mg by mouth once a week., Disp: , Rfl:    atorvastatin (LIPITOR) 40 MG tablet, Take 1 tablet (40 mg total) by mouth daily., Disp: 90 tablet, Rfl: 0   carvedilol (COREG) 25 MG tablet, Take 25 mg by mouth 2 (two) times daily., Disp: , Rfl:    clobetasol ointment (TEMOVATE) 0.05 %, Apply 1 Application topically 2 (two) times daily., Disp: , Rfl:    losartan (COZAAR) 100 MG tablet, Take 100 mg by mouth daily., Disp: , Rfl:    oxybutynin (DITROPAN) 5 MG tablet, Take 5 mg by mouth daily., Disp: , Rfl:    Subjective:   PATIENT ID: Hannah Larsen: female DOB: Aug 19, 1945, MRN: [id number]  Chief Complaint  Patient presents with   pulmonary consult    CT 08/14/2022- occ prod cough with green sputum.     HPI  The patient is a pleasant 77 year old female presenting to the clinic to establish care.  Patient reports being ill with an upper respiratory tract infection for which she was seen by her primary care physician and prescribed Augmentin (which caused her an allergy).  She did not report to her PCP that she was short of breath with exertion, especially after going grocery shopping which was new for her.  She was referred to cardiology and was seen by Dr. Sandie Ano under workup for CAD was ensued.  Patient underwent an echocardiogram and a coronary CT both of which returned within normal.  The coronary CT did show emphysema  as well as left lingular nodule and patient is referred to pulmonary to discuss this further.  Today, the patient reports that her shortness of breath is fully resolved and she has no chief complaint.  She feels no dyspnea with exertion, and does not have a cough.  She denies any chest pain, chest tightness, wheezing, night sweats, hemoptysis, or weight loss.  She feels in her usual state of health and is able to do her shopping without any shortness of breath.  She does have to  take a second to rest after severe exertion (such as going up a hill) but this is not out of the ordinary for her. She does feel her allergies have acted up more this year and has somewhat of a runny nose constantly.  Patient has a past medical history of hypertension and recently diagnosed nonobstructive CAD with mild proximal LAD/RCA disease for which she is medically managed.  She has history of smoking, having started at the age of 63 and quit 6 years ago (between 13 and 50 pack years of smoking history). Patient helps take care of her aging mother (43 years old) and of her husband who is diagnosed with prostate cancer and feels she has a lot on her plate at the moment.  Ancillary information including prior medications, full medical/surgical/family/social histories, and PFTs (when available) are listed below and have been reviewed.   Review of Systems  Constitutional:  Negative for chills, diaphoresis, fever, malaise/fatigue and weight loss.  Respiratory:  Negative for cough, hemoptysis, sputum production, shortness of breath and wheezing.   Cardiovascular:  Negative for chest pain, palpitations and leg swelling.     Objective:   Vitals:   10/22/22 0901  BP: 124/74  Pulse: 63  Temp: 98 F (36.7 C)  TempSrc: Temporal  SpO2: 94%  Weight: 137 lb 9.6 oz (62.4 kg)  Height: 5' (1.524 m)   94% on RA BMI Readings from Last 3 Encounters:  10/22/22 26.87 kg/m  09/23/22 26.76 kg/m  07/31/22 25.88 kg/m   Wt Readings from Last 3 Encounters:  10/22/22 137 lb 9.6 oz (62.4 kg)  09/23/22 137 lb (62.1 kg)  07/31/22 139 lb 3.2 oz (63.1 kg)    Physical Exam Constitutional:      Appearance: Normal appearance. She is not ill-appearing.  HENT:     Head: Normocephalic.     Nose: Nose normal.  Cardiovascular:     Rate and Rhythm: Normal rate and regular rhythm.     Pulses: Normal pulses.     Heart sounds: Normal heart sounds.  Pulmonary:     Effort: Pulmonary effort is normal.      Breath sounds: Normal breath sounds. No wheezing or rales.  Abdominal:     Palpations: Abdomen is soft.  Musculoskeletal:     Right lower leg: No edema.     Left lower leg: No edema.  Neurological:     General: No focal deficit present.     Mental Status: She is alert and oriented to person, place, and time. Mental status is at baseline.       Ancillary Information    Past Medical History:  Diagnosis Date   Hypertension      Family History  Problem Relation Age of Onset   Breast cancer Neg Hx      No past surgical history on file.  Social History   Socioeconomic History   Marital status: Married    Spouse name: Not on file   Number  of children: Not on file   Years of education: Not on file   Highest education level: Not on file  Occupational History   Not on file  Tobacco Use   Smoking status: Former    Packs/day: 0.75    Years: 55.00    Additional pack years: 0.00    Total pack years: 41.25    Types: Cigarettes    Quit date: 2018    Years since quitting: 6.3   Smokeless tobacco: Never  Vaping Use   Vaping Use: Never used  Substance and Sexual Activity   Alcohol use: Never   Drug use: Never   Sexual activity: Not on file  Other Topics Concern   Not on file  Social History Narrative   Not on file   Social Determinants of Health   Financial Resource Strain: Not on file  Food Insecurity: Not on file  Transportation Needs: Not on file  Physical Activity: Not on file  Stress: Not on file  Social Connections: Not on file  Intimate Partner Violence: Not on file     Allergies  Allergen Reactions   Benadryl [Diphenhydramine]     Heart races   Levocetirizine Diarrhea   Amoxicillin-Pot Clavulanate Rash    Skin peels and red     CBC No results found for: "WBC", "RBC", "HGB", "HCT", "PLT", "MCV", "MCH", "MCHC", "RDW", "LYMPHSABS", "MONOABS", "EOSABS", "BASOSABS"  Pulmonary Functions Testing Results:     No data to display           Outpatient Medications Prior to Visit  Medication Sig Dispense Refill   acyclovir (ZOVIRAX) 400 MG tablet Take 1 tablet by mouth 3 (three) times daily.     amLODipine (NORVASC) 10 MG tablet Take 10 mg by mouth daily.     ASPIRIN 81 PO Take 81 mg by mouth once a week.     atorvastatin (LIPITOR) 40 MG tablet Take 1 tablet (40 mg total) by mouth daily. 90 tablet 0   carvedilol (COREG) 25 MG tablet Take 25 mg by mouth 2 (two) times daily.     clobetasol ointment (TEMOVATE) 0.05 % Apply 1 Application topically 2 (two) times daily.     losartan (COZAAR) 100 MG tablet Take 100 mg by mouth daily.     oxybutynin (DITROPAN) 5 MG tablet Take 5 mg by mouth daily.     No facility-administered medications prior to visit.

## 2022-11-03 ENCOUNTER — Other Ambulatory Visit: Payer: Self-pay

## 2022-11-03 MED ORDER — ATORVASTATIN CALCIUM 40 MG PO TABS: 40.0000 mg | ORAL_TABLET | Freq: Every day | ORAL | 2 refills | Status: DC

## 2022-12-22 ENCOUNTER — Ambulatory Visit: Payer: Medicare Other | Attending: Family Medicine

## 2022-12-27 IMAGING — MG DIGITAL DIAGNOSTIC BILAT W/ TOMO W/ CAD
6 of 12 series · 6 of 36 positions shown · non-contrast
Comparison: Previous exam(s).

CLINICAL DATA: Monthly LEFT nipple itching and flaking for 3-4
years

EXAM:
DIGITAL DIAGNOSTIC BILATERAL MAMMOGRAM WITH TOMOSYNTHESIS AND CAD;
ULTRASOUND LEFT BREAST LIMITED
TECHNIQUE: Bilateral digital diagnostic mammography and breast tomosynthesis
was performed. The images were evaluated with computer-aided
detection.; Targeted ultrasound examination of the left breast was
performed.

[R MLO synth-2D]
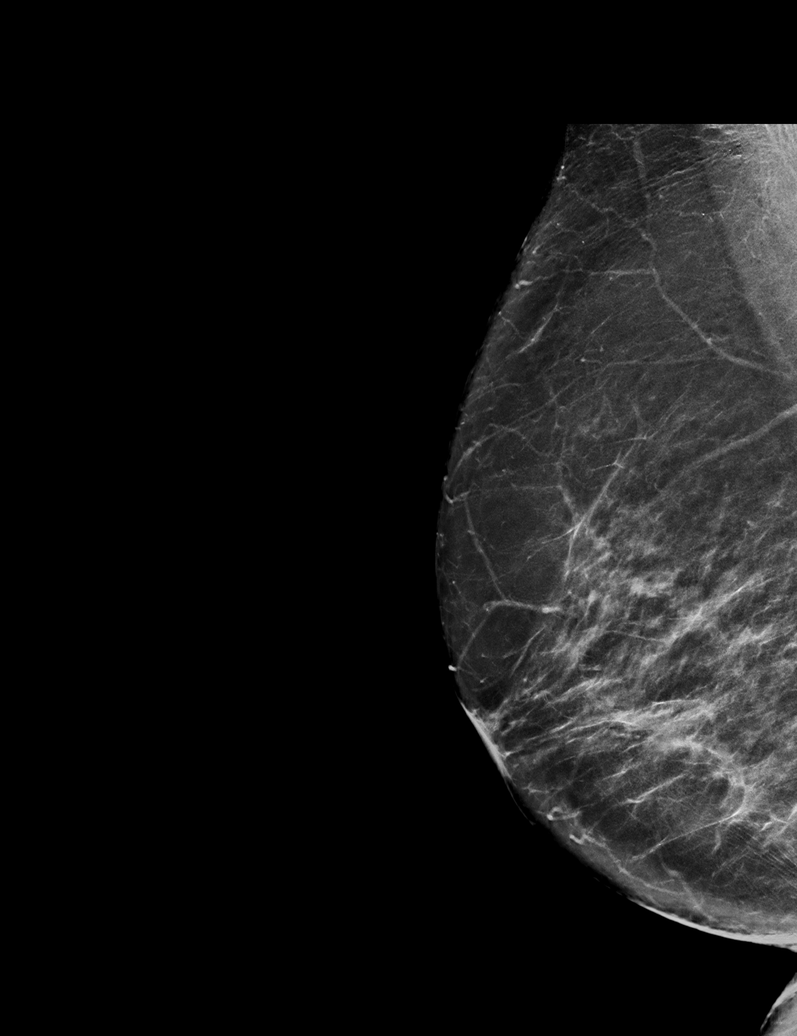

[L MLO synth-2D (1 of 2)]
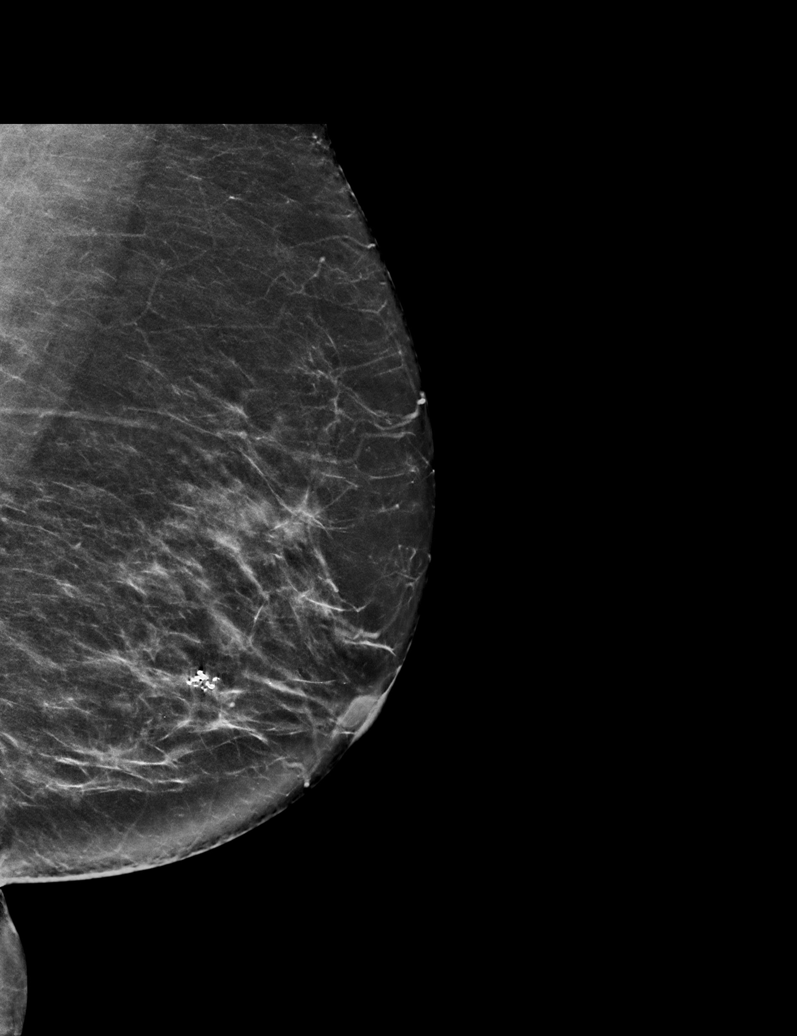

[L CC synth-2D]
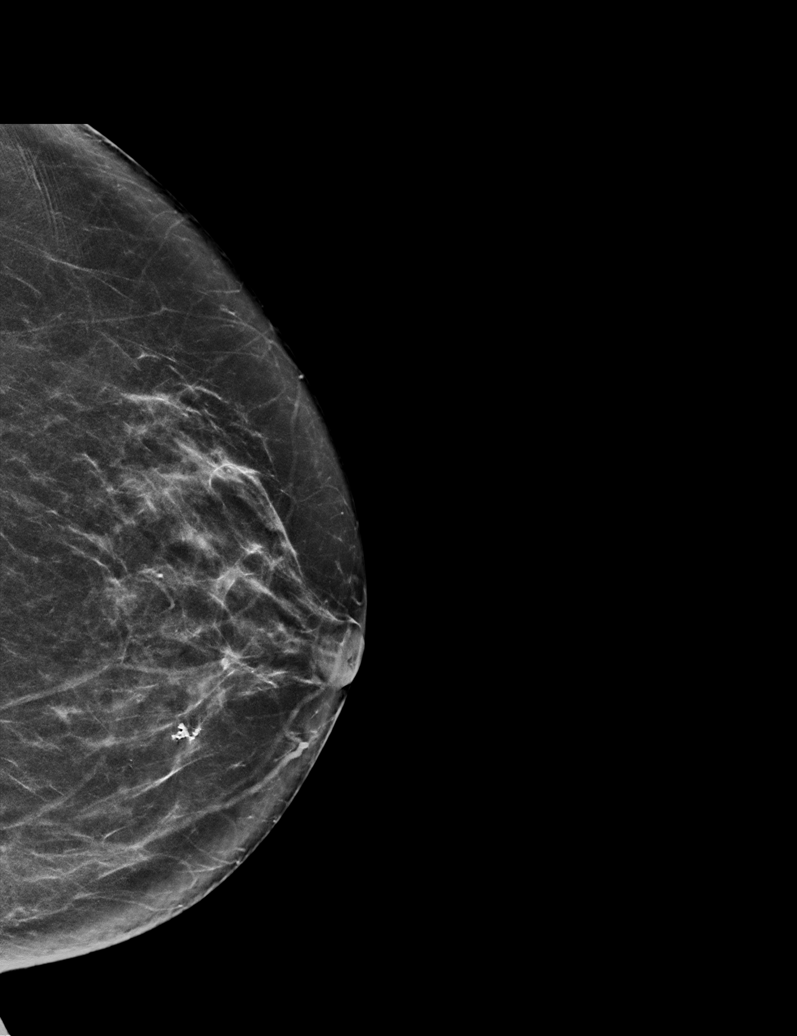

[R CC synth-2D (1 of 2)]
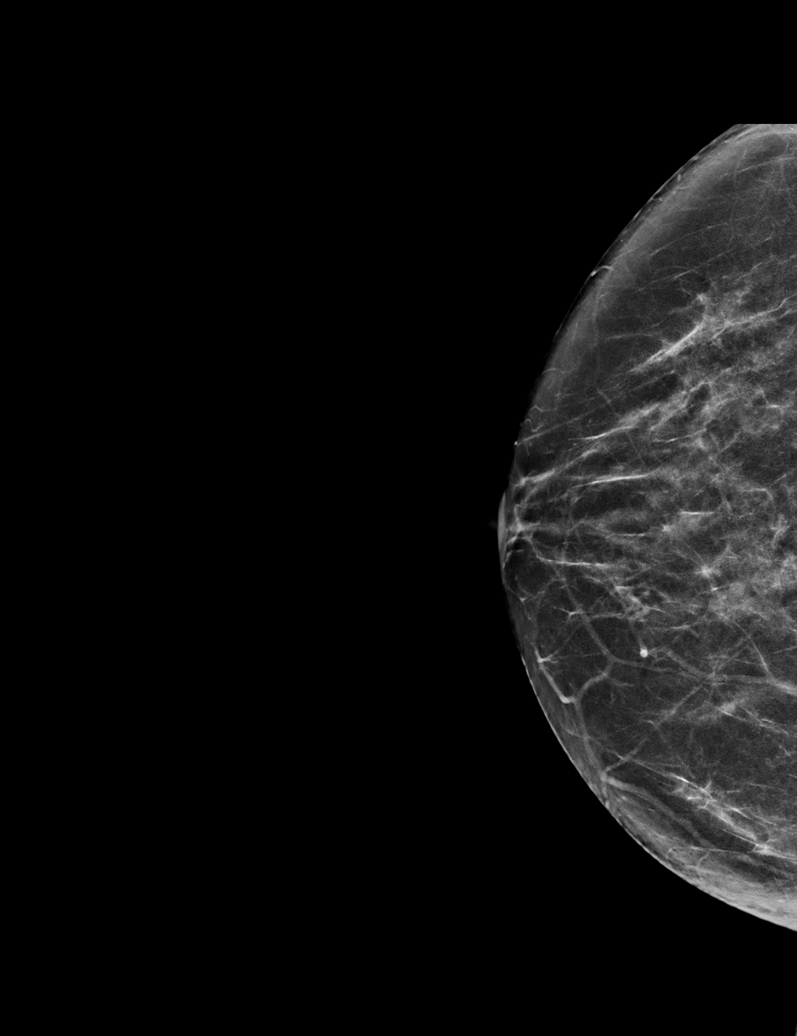

[L MLO synth-2D (2 of 2)]
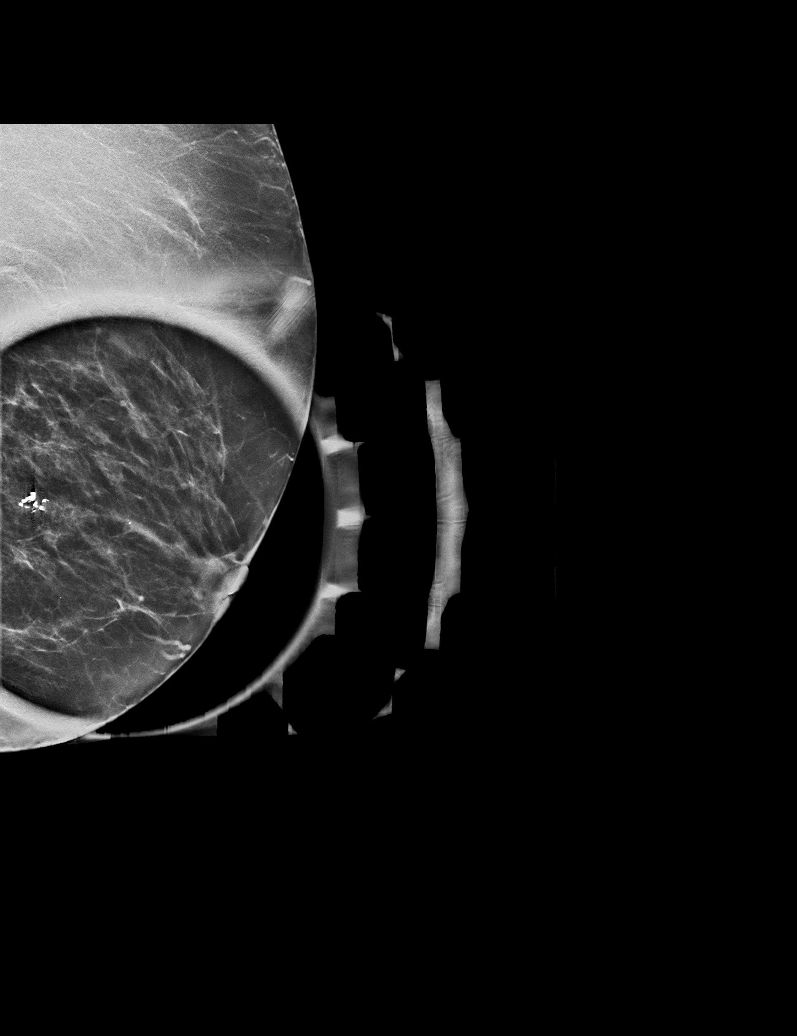

[R CC synth-2D (2 of 2)]
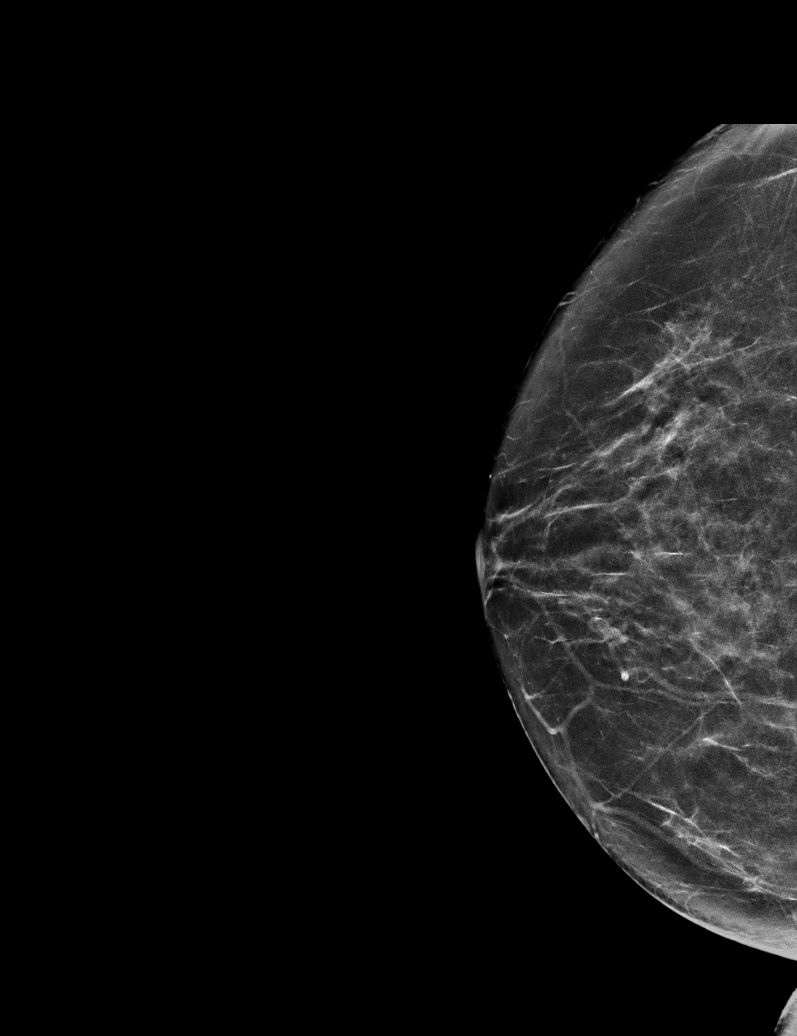

[6 of 36 positions shown; findings below may reference images not displayed]

ACR Breast Density Category b: There are scattered areas of
fibroglandular density.
FINDINGS: Spot compression tomosynthesis views of the LEFT retroareolar breast
demonstrate no suspicious etiology for recurrent LEFT nipple
itching/flaking. No suspicious nipple inversion is identified
mammographically. No suspicious mass, distortion, or
microcalcifications are identified to suggest presence of malignancy
bilaterally.

On physical exam, no suspicious nipple changes are identified. No
suspicious mass is appreciated.

Targeted LEFT retroareolar ultrasound was performed. No suspicious
cystic or solid mass is seen. No suspicious intraductal mass is
noted.
IMPRESSION: 1. No mammographic or sonographic evidence of malignancy in the
retroareolar LEFT breast. Symptoms may be due to atopic dermatitis;
if they persist, consider allergy testing and dermatology referral.
Any further workup of the patient's symptoms should be based on the
clinical assessment. Recommend routine annual screening mammogram in
1 year.
2. No mammographic evidence of malignancy bilaterally.

RECOMMENDATION:
Screening mammogram in one year.(Code:ZE-E-0C3)

I have discussed the findings and recommendations with the patient.
If applicable, a reminder letter will be sent to the patient
regarding the next appointment.

BI-RADS CATEGORY  1: Negative.

## 2023-01-19 ENCOUNTER — Ambulatory Visit: Payer: Medicare Other | Admitting: Student in an Organized Health Care Education/Training Program

## 2023-02-18 ENCOUNTER — Other Ambulatory Visit: Payer: Self-pay | Admitting: Family Medicine

## 2023-02-18 DIAGNOSIS — Z1231 Encounter for screening mammogram for malignant neoplasm of breast: Secondary | ICD-10-CM

## 2023-03-26 ENCOUNTER — Ambulatory Visit
Admission: RE | Admit: 2023-03-26 | Discharge: 2023-03-26 | Disposition: A | Discharge status: Home / Self Care | Payer: Medicare Other | Source: Ambulatory Visit | Attending: Family Medicine | Admitting: Family Medicine

## 2023-03-26 DIAGNOSIS — Z1231 Encounter for screening mammogram for malignant neoplasm of breast: Secondary | ICD-10-CM | POA: Diagnosis present

## 2023-05-01 ENCOUNTER — Telehealth: Payer: Self-pay | Admitting: Cardiology

## 2023-05-01 NOTE — Telephone Encounter (Signed)
Received fax from Dr Burnett Sheng to be seen for SOB Left message on voice mail to schedule

## 2023-05-18 ENCOUNTER — Other Ambulatory Visit: Payer: Self-pay | Admitting: *Deleted

## 2023-05-18 MED ORDER — ATORVASTATIN CALCIUM 40 MG PO TABS: 40.0000 mg | ORAL_TABLET | Freq: Every day | ORAL | 0 refills | Status: DC

## 2023-06-02 ENCOUNTER — Other Ambulatory Visit: Payer: Self-pay | Admitting: Family Medicine

## 2023-06-02 DIAGNOSIS — R911 Solitary pulmonary nodule: Secondary | ICD-10-CM

## 2023-06-17 ENCOUNTER — Ambulatory Visit
Admission: RE | Admit: 2023-06-17 | Discharge: 2023-06-17 | Disposition: A | Discharge status: Home / Self Care | Payer: Medicare Other | Source: Ambulatory Visit | Attending: Family Medicine | Admitting: Family Medicine

## 2023-06-17 DIAGNOSIS — R911 Solitary pulmonary nodule: Secondary | ICD-10-CM | POA: Insufficient documentation

## 2023-07-20 ENCOUNTER — Other Ambulatory Visit: Payer: Self-pay

## 2023-07-20 MED ORDER — ATORVASTATIN CALCIUM 40 MG PO TABS: 40.0000 mg | ORAL_TABLET | Freq: Every day | ORAL | 0 refills | Status: DC

## 2023-09-22 ENCOUNTER — Other Ambulatory Visit: Payer: Self-pay

## 2023-09-22 MED ORDER — ATORVASTATIN CALCIUM 40 MG PO TABS: 40.0000 mg | ORAL_TABLET | Freq: Every day | ORAL | 0 refills | Status: DC

## 2023-10-13 ENCOUNTER — Other Ambulatory Visit: Payer: Self-pay

## 2023-10-13 MED ORDER — ATORVASTATIN CALCIUM 40 MG PO TABS: 40.0000 mg | ORAL_TABLET | Freq: Every day | ORAL | 0 refills | Status: DC

## 2023-10-14 NOTE — Progress Notes (Signed)
 Cardiology Clinic Note   Date: 10/16/2023 ID: Hannah Larsen, DOB 1946-01-02, MRN [id number]  Primary Cardiologist:  Constancia Delton, MD  Chief Complaint   Hannah Larsen is a 78 y.o. female who presents to the clinic today for routine follow up.   Patient Profile   Hannah Larsen is followed by Dr. Junnie Olives for the history outlined below.      Past medical history significant for: Nonobstructive CAD. Coronary CTA 08/14/2022: Coronary calcium  score 1388 (96th percentile).  Mild proximal LAD and RCA stenosis, minimal proximal LCx stenosis.  Radiology overread: Pericardial effusion, bibasilar consolidation or volume loss, COPD emphysema, indeterminate nodule in the lingula. Pericardial effusion. Echo 09/05/2022: EF 60 to 65%.  No RWMA.  Grade II DD.  Normal RV size/function.  Normal PA pressure, RVSP 34.6 mmHg.  Moderate to severe TR.  Trivial pericardial effusion. Hypertension. Hyperlipidemia. Lipid panel 05/25/2023: LDL 63, HDL 62, TG 118, total 149. COPD. Former tobacco abuse.  In summary, patient was first evaluated by Dr. Junnie Olives on 07/31/2018 for for shortness of breath and left ankle edema.  She reported shortness of breath 2 months prior when diagnosed with strep throat.  She had a reaction/rash from Augmentin.  She had been dyspneic with minimal exertion since that time.  She underwent coronary CTA which showed nonobstructive CAD as detailed above.  Incidental finding of pericardial effusion noted on radiology overread.  Simvastatin was switched to Lipitor.  Echo demonstrated normal LV/RV function and trivial pericardial effusion as detailed above.  Patient was last seen in the office by Dr. Junnie Olives on 09/23/2022 for follow-up after testing.  BP was well-controlled at the time of her visit.  She was referred to pulmonology for further evaluation of COPD and nodule detected on coronary CTA.     History of Present Illness    Today, patient is accompanied by her  husband. She reports she is doing well. Patient denies shortness of breath, orthopnea or PND. She reports dyspnea with heavier exertion such as walking up a long hill from her mailbox to her house. She reports chronic L>R lower extremity edema that has been unchanged for years. She injured her left leg years ago and has had edema ever since. She manages it with compression stockings. No chest pain, pressure, or tightness. No palpitations. She is independent with ADLs and performs light to moderate household chores. She does not participate in routine exercise. She was doing a lot of walking between her home and her mother's when she was caring for her. Her mother has since passed in January and patient has been more sedentary.  She stopped taking aspirin secondary to increased bruising. She has tried taking it every other day then once a week per her PCP but continued to have easy bruising so it was stopped altogether.     ROS: All other systems reviewed and are otherwise negative except as noted in History of Present Illness.  EKGs/Labs Reviewed    EKG Interpretation Date/Time:  Friday October 16 2023 07:48:09 EDT Ventricular Rate:  63 PR Interval:  148 QRS Duration:  72 QT Interval:  386 QTC Calculation: 395 R Axis:   87  Text Interpretation: Normal sinus rhythm Septal infarct , age undetermined When compared with ECG of 07/31/2022 (not in [facility]) No significant change Confirmed by Morey Ar [phone]) on 10/16/2023 8:02:02 AM    Physical Exam    VS:  BP 102/60   Pulse 63   Ht 5' (1.524 m)   Wt  140 lb (63.5 kg)   SpO2 92%   BMI 27.34 kg/m  , BMI Body mass index is 27.34 kg/m.  GEN: Well nourished, well developed, in no acute distress. Neck: No JVD or carotid bruits. Cardiac:  RRR. No murmurs. No rubs or gallops.   Respiratory:  Respirations regular and unlabored. Clear to auscultation without rales, wheezing or rhonchi. GI: Soft, nontender, nondistended. Extremities:  Radials/DP/PT 2+ and equal bilaterally. No clubbing or cyanosis. Mild L>R nonpitting edema lower extremities. Compression stockings in place.  Skin: Warm and dry, no rash. Neuro: Strength intact.  Assessment & Plan   Nonobstructive CAD Coronary CTA February 2024 demonstrated calcium  score of 1388, mild proximal LAD and RCA stenosis, minimal proximal LCx stenosis.  Patient denies chest pain, pressure or tightness. She has dyspnea with heavier exertion but none with routine activities. She is independent with ADLs and household activities. She has been more sedentary since her mother passed in January. She is not taking aspirin secondary to increased bruising. She tried taking it every other day then once a week but continued to have easy bruising so PCP instructed her to stop it. EKG without acute changes.  -Encouraged to increase physical activity as tolerated.  - Continue atorvastatin , carvedilol.  Hypertension BP today 102/60. No  headaches or dizziness reported.  - Continue amlodipine, carvedilol, losartan.  Hyperlipidemia LDL 63 November 2024, at goal. - Continue atorvastatin . -Continue to follow with PCP.   Disposition: Return in 1 year or sooner as needed.          Signed, Lonell Rives. Bogdan Vivona, DNP, NP-C

## 2023-10-16 ENCOUNTER — Ambulatory Visit: Attending: Student | Admitting: Student

## 2023-10-16 ENCOUNTER — Encounter: Payer: Self-pay | Admitting: Student

## 2023-10-16 VITALS — BP 102/60 | HR 63 | Ht 60.0 in | Wt 140.0 lb

## 2023-10-16 DIAGNOSIS — I2510 Atherosclerotic heart disease of native coronary artery without angina pectoris: Secondary | ICD-10-CM | POA: Diagnosis not present

## 2023-10-16 DIAGNOSIS — E785 Hyperlipidemia, unspecified: Secondary | ICD-10-CM

## 2023-10-16 DIAGNOSIS — I10 Essential (primary) hypertension: Secondary | ICD-10-CM

## 2023-10-16 NOTE — Patient Instructions (Signed)
 Medication Instructions:  No changes to current medications.    *If you need a refill on your cardiac medications before your next appointment, please call your pharmacy*  Lab Work: NA If you have labs (blood work) drawn today and your tests are completely normal, you will receive your results only by: MyChart Message (if you have MyChart) OR A paper copy in the mail If you have any lab test that is abnormal or we need to change your treatment, we will call you to review the results.  Testing/Procedures: Na   Follow-Up: At [facility], you and your health needs are our priority.  As part of our continuing mission to provide you with exceptional heart care, our providers are all part of one team.  This team includes your primary Cardiologist (physician) and Advanced Practice Providers or APPs (Physician Assistants and Nurse Practitioners) who all work together to provide you with the care you need, when you need it.  Your next appointment:   12 month(s)  Provider:   Constancia Delton, MD or Morey Ar, NP

## 2023-11-02 ENCOUNTER — Other Ambulatory Visit: Payer: Self-pay

## 2023-11-02 MED ORDER — ATORVASTATIN CALCIUM 40 MG PO TABS: 40.0000 mg | ORAL_TABLET | Freq: Every day | ORAL | 11 refills | Status: AC

## 2024-02-26 ENCOUNTER — Other Ambulatory Visit: Payer: Self-pay | Admitting: Family Medicine

## 2024-02-26 DIAGNOSIS — Z1231 Encounter for screening mammogram for malignant neoplasm of breast: Secondary | ICD-10-CM

## 2024-03-28 ENCOUNTER — Ambulatory Visit
Admission: RE | Admit: 2024-03-28 | Discharge: 2024-03-28 | Disposition: A | Discharge status: Home / Self Care | Source: Ambulatory Visit | Attending: Family Medicine | Admitting: Family Medicine

## 2024-03-28 DIAGNOSIS — Z1231 Encounter for screening mammogram for malignant neoplasm of breast: Secondary | ICD-10-CM | POA: Diagnosis present
